# Patient Record
Sex: Female | Born: 1960
Health system: Southern US, Community
[De-identification: ages and names within clinical notes are randomized; demographics above are authoritative.]

## PROBLEM LIST (undated history)

## (undated) DIAGNOSIS — K219 Gastro-esophageal reflux disease without esophagitis: Secondary | ICD-10-CM

## (undated) DIAGNOSIS — F419 Anxiety disorder, unspecified: Secondary | ICD-10-CM

## (undated) HISTORY — PX: OTHER SURGICAL HISTORY: SHX169

## (undated) HISTORY — DX: Anxiety disorder, unspecified: F41.9

## (undated) HISTORY — PX: TUBAL LIGATION: SHX77

## (undated) HISTORY — DX: Gastro-esophageal reflux disease without esophagitis: K21.9

## (undated) HISTORY — PX: TONSILLECTOMY: SUR1361

## (undated) HISTORY — PX: LAPAROSCOPIC CHOLECYSTECTOMY: SUR755

---

## 2004-09-19 ENCOUNTER — Ambulatory Visit: Payer: Self-pay

## 2005-12-31 ENCOUNTER — Ambulatory Visit: Payer: Self-pay

## 2007-01-06 ENCOUNTER — Ambulatory Visit: Payer: Self-pay

## 2008-02-29 ENCOUNTER — Ambulatory Visit: Payer: Self-pay

## 2010-01-01 ENCOUNTER — Ambulatory Visit: Payer: Self-pay

## 2010-01-16 ENCOUNTER — Ambulatory Visit: Payer: Self-pay

## 2011-05-27 ENCOUNTER — Ambulatory Visit: Payer: Self-pay | Admitting: General Practice

## 2012-07-29 ENCOUNTER — Ambulatory Visit: Payer: Self-pay

## 2012-11-04 ENCOUNTER — Ambulatory Visit: Payer: Self-pay | Admitting: Unknown Physician Specialty

## 2012-11-07 LAB — PATHOLOGY REPORT

## 2013-03-24 ENCOUNTER — Ambulatory Visit: Payer: Self-pay | Admitting: Family Medicine

## 2013-07-31 ENCOUNTER — Ambulatory Visit: Payer: Self-pay

## 2014-05-30 ENCOUNTER — Encounter: Payer: Self-pay | Admitting: *Deleted

## 2014-12-21 ENCOUNTER — Encounter: Payer: Self-pay | Admitting: Family Medicine

## 2014-12-21 ENCOUNTER — Ambulatory Visit (INDEPENDENT_AMBULATORY_CARE_PROVIDER_SITE_OTHER): Payer: 59 | Admitting: Family Medicine

## 2014-12-21 VITALS — BP 110/80 | HR 76 | Temp 98.3°F | Resp 16 | Ht 62.0 in | Wt 228.4 lb

## 2014-12-21 DIAGNOSIS — R202 Paresthesia of skin: Secondary | ICD-10-CM

## 2014-12-21 DIAGNOSIS — E785 Hyperlipidemia, unspecified: Secondary | ICD-10-CM | POA: Insufficient documentation

## 2014-12-21 MED ORDER — VALACYCLOVIR HCL 1 G PO TABS: 1000.0000 mg | ORAL_TABLET | Freq: Three times a day (TID) | ORAL | Status: DC

## 2014-12-21 NOTE — Patient Instructions (Signed)
Let us know if there are new symptoms or not improving.

## 2014-12-21 NOTE — Progress Notes (Signed)
Subjective:     Patient ID: Kaylee Jones, female   DOB: 1960-10-14, 54 y.o.   MRN: [id number]  HPI  Chief Complaint  Patient presents with  . Rash    Patient comes in office today with concerns of a possible shingles outbreak. Patient reports that she has had itching and burning of the skin on the right side of her abdomen since Friday. Patient reports there is no visible rash but that she has had shingles before and had similar symptoms.   States it started out as an area of pain one week ago but now feels skin sensitivity/burning. Prior episode of shingles was in the same location. 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 Denies change in bowel or bladder habits. S/P cholecystectomy.   Review of Systems  Constitutional: Negative for fever and chills.       Objective:   Physical Exam  Constitutional: She appears well-developed and well-nourished. No distress.  Abdominal: Soft. There is no tenderness.  Localizes discomfort to right upper quadrant but no rash noted.       Assessment:    1. Paresthesias: most likely early shingles. - valACYclovir (VALTREX) 1000 MG tablet; Take 1 tablet (1,000 mg total) by mouth 3 (three) times daily.  Dispense: 21 tablet; Refill: 0    Plan:    F/u if not improving or new symptoms.

## 2016-02-13 ENCOUNTER — Ambulatory Visit: Payer: Self-pay | Admitting: Physician Assistant

## 2016-02-13 ENCOUNTER — Encounter: Payer: Self-pay | Admitting: Physician Assistant

## 2016-02-13 VITALS — BP 140/92 | HR 88 | Temp 99.5°F

## 2016-02-13 DIAGNOSIS — J069 Acute upper respiratory infection, unspecified: Secondary | ICD-10-CM

## 2016-02-13 LAB — POCT INFLUENZA A/B
INFLUENZA B, POC: NEGATIVE
Influenza A, POC: NEGATIVE

## 2016-02-13 MED ORDER — AZITHROMYCIN 250 MG PO TABS
ORAL_TABLET | ORAL | 0 refills | Status: DC
Start: 2016-02-13 — End: 2016-03-09

## 2016-02-13 NOTE — Progress Notes (Signed)
S: C/o runny nose and congestion with dry cough for 3 days, + fever, chills, denies cp/sob, v/d; mucus was green this am but clear throughout the day, cough is sporadic,   Using otc meds: took alka seltzer cold with fever reducer this am  O: PE: vitals w temp at 99.5 nad,  perrl eomi, normocephalic, tms dull, nasal mucosa red and swollen, throat injected, neck supple no lymph, lungs c t a, cv rrr, neuro intact, flu swab neg  A:  Acute flu like illness   P: drink fluids, continue regular meds , use otc meds of choice, return if not improving in 5 days, return earlier if worsening , zpack

## 2016-03-09 ENCOUNTER — Ambulatory Visit: Payer: Self-pay | Admitting: Physician Assistant

## 2016-03-09 ENCOUNTER — Encounter: Payer: Self-pay | Admitting: Physician Assistant

## 2016-03-09 VITALS — BP 142/94 | HR 84 | Temp 98.5°F

## 2016-03-09 DIAGNOSIS — J209 Acute bronchitis, unspecified: Secondary | ICD-10-CM

## 2016-03-09 MED ORDER — LEVOFLOXACIN 500 MG PO TABS: 500.0000 mg | ORAL_TABLET | Freq: Every day | ORAL | 0 refills | Status: DC

## 2016-03-09 MED ORDER — ALBUTEROL SULFATE HFA 108 (90 BASE) MCG/ACT IN AERS: 2.0000 | INHALATION_SPRAY | Freq: Four times a day (QID) | RESPIRATORY_TRACT | 0 refills | Status: DC | PRN

## 2016-03-09 MED ORDER — METHYLPREDNISOLONE 4 MG PO TBPK: ORAL_TABLET | ORAL | 0 refills | Status: DC

## 2016-03-09 NOTE — Progress Notes (Addendum)
S: c/o continued cough and congestion, some wheezing, low grade temp today, no body aches, finished zpack in December but never got well, just can't quit coughing, no cp/sob, no v/d  O: vitals wnl, nad, tms clear, nasal mucosa red, throat injected, neck supple no lymph, lungs c t a, cv rrr,   A: acute bronchitis  P: levaquin 500mg  qd, medrol dose pack, albuterol inhaler, if temp goes up return to clinic for repeat flu swab as was negative in December  Pt called and is still coughing, ?if could get cxr, cxr ordered

## 2016-04-01 ENCOUNTER — Ambulatory Visit
Admission: RE | Admit: 2016-04-01 | Discharge: 2016-04-01 | Disposition: A | Discharge status: Home / Self Care | Payer: 59 | Source: Ambulatory Visit | Attending: Physician Assistant | Admitting: Physician Assistant

## 2016-04-01 DIAGNOSIS — J209 Acute bronchitis, unspecified: Secondary | ICD-10-CM | POA: Insufficient documentation

## 2016-04-01 DIAGNOSIS — R05 Cough: Secondary | ICD-10-CM | POA: Insufficient documentation

## 2016-04-01 NOTE — Addendum Note (Signed)
Addended by: Versie Starks on: 04/01/2016 10:59 AM   Modules accepted: Orders

## 2016-08-13 DIAGNOSIS — H5711 Ocular pain, right eye: Secondary | ICD-10-CM | POA: Diagnosis not present

## 2017-02-05 ENCOUNTER — Encounter: Payer: Self-pay | Admitting: Family Medicine

## 2017-02-05 ENCOUNTER — Other Ambulatory Visit: Payer: Self-pay | Admitting: Family Medicine

## 2017-02-05 DIAGNOSIS — Z1239 Encounter for other screening for malignant neoplasm of breast: Secondary | ICD-10-CM

## 2017-02-12 ENCOUNTER — Ambulatory Visit (INDEPENDENT_AMBULATORY_CARE_PROVIDER_SITE_OTHER): Payer: 59 | Admitting: Family Medicine

## 2017-02-12 ENCOUNTER — Other Ambulatory Visit: Payer: Self-pay | Admitting: Family Medicine

## 2017-02-12 ENCOUNTER — Encounter: Payer: Self-pay | Admitting: Family Medicine

## 2017-02-12 VITALS — BP 116/82 | HR 82 | Temp 98.9°F | Resp 16 | Ht 62.0 in | Wt 231.0 lb

## 2017-02-12 DIAGNOSIS — Z87898 Personal history of other specified conditions: Secondary | ICD-10-CM | POA: Diagnosis not present

## 2017-02-12 DIAGNOSIS — E782 Mixed hyperlipidemia: Secondary | ICD-10-CM

## 2017-02-12 DIAGNOSIS — Z1159 Encounter for screening for other viral diseases: Secondary | ICD-10-CM | POA: Diagnosis not present

## 2017-02-12 DIAGNOSIS — F439 Reaction to severe stress, unspecified: Secondary | ICD-10-CM

## 2017-02-12 DIAGNOSIS — Z0000 Encounter for general adult medical examination without abnormal findings: Secondary | ICD-10-CM

## 2017-02-12 NOTE — Patient Instructions (Addendum)
We will call you with the lab results Encourage walking/exercise program 30 minutes daily.

## 2017-02-12 NOTE — Progress Notes (Signed)
Subjective:     Patient ID: Kaylee Jones, female   DOB: [birth year], 56 y.o.   MRN: [id number] Chief Complaint  Patient presents with  . Annual Exam    Pt reports that she has been feeling well, sleeping well bu tnot excerising. Declines Td today. she thinks she has gotten it at work. Pt had flu vaccine.   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  Review of Systems General: Feeling well HEENT: regular dental visits and pending eye exam (glasses). Cardiovascular: no chest pain or shortness of breath. Reports history of palpitations in the past and wishes a baseline EKG. GI: occasional heartburn, no change in bowel habits or blood in the stool. Colonoscopy due 2024. Has episodic hemorrhoids which she treats with preparation. Denis straining but does like to sit on commode to read. GU:  no change in bladder habits. Pap at [facility] in 2015. Psychiatric: not depressed,  Rarely will use Xanax for stress ("I keep it in my purse"). Musculoskeletal: Has hx of left knee pain with w.b.but resolved spontaneously.    Objective:   Physical Exam  Constitutional: She appears well-developed and well-nourished. No distress.  Eyes: PERRLA, EOMI Neck: no thyromegaly, tenderness or nodules, no cervical adenopathy or carotid bruits. ENT: TM's intact without inflammation; No tonsillar enlargement or exudate, Lungs: Clear Breasts: no axillary adenopathy, no breast mass, dimpling or nipple discharge. Heart : RRR without murmur or gallop Abd: bowel sounds present, soft, non-tender, no organomegaly Extremities: no edema     Assessment:    1. Annual physical exam - COMPLETE METABOLIC PANEL WITH GFR  2. History of palpitations - EKG 12-Lead - COMPLETE METABOLIC PANEL WITH GFR  3. Mixed hyperlipidemia - Lipid panel  4. Encounter for hepatitis C screening test for low risk patient - Hepatitis C Antibody  5. Situational stress: Xanax prn    Plan:    Further f/u pending lab work. Encouraged  regular exercise.

## 2017-02-13 LAB — COMPLETE METABOLIC PANEL WITH GFR
AG Ratio: 1.3 (calc) (ref 1.0–2.5)
ALKALINE PHOSPHATASE (APISO): 94 U/L (ref 33–130)
ALT: 14 U/L (ref 6–29)
AST: 16 U/L (ref 10–35)
Albumin: 3.8 g/dL (ref 3.6–5.1)
BILIRUBIN TOTAL: 0.5 mg/dL (ref 0.2–1.2)
BUN: 9 mg/dL (ref 7–25)
CHLORIDE: 107 mmol/L (ref 98–110)
CO2: 26 mmol/L (ref 20–32)
Calcium: 8.4 mg/dL — ABNORMAL LOW (ref 8.6–10.4)
Creat: 0.81 mg/dL (ref 0.50–1.05)
GFR, EST AFRICAN AMERICAN: 95 mL/min/{1.73_m2} (ref >=60)
GFR, Est Non African American: 82 mL/min/{1.73_m2} (ref >=60)
GLUCOSE: 104 mg/dL — AB (ref 65–99)
Globulin: 2.9 g/dL (calc) (ref 1.9–3.7)
POTASSIUM: 4.4 mmol/L (ref 3.5–5.3)
Sodium: 140 mmol/L (ref 135–146)
TOTAL PROTEIN: 6.7 g/dL (ref 6.1–8.1)

## 2017-02-13 LAB — LIPID PANEL
CHOL/HDL RATIO: 4.3 (calc) (ref <5.0)
Cholesterol: 235 mg/dL — ABNORMAL HIGH (ref <200)
HDL: 55 mg/dL (ref >50)
LDL Cholesterol (Calc): 157 mg/dL (calc) — ABNORMAL HIGH
NON-HDL CHOLESTEROL (CALC): 180 mg/dL — AB (ref <130)
TRIGLYCERIDES: 109 mg/dL (ref <150)

## 2017-02-13 LAB — HEPATITIS C ANTIBODY
HEP C AB: NONREACTIVE
SIGNAL TO CUT-OFF: 0.01 (ref <1.00)

## 2017-02-15 ENCOUNTER — Encounter: Payer: Self-pay | Admitting: Family Medicine

## 2017-02-15 ENCOUNTER — Telehealth: Payer: Self-pay

## 2017-02-15 NOTE — Telephone Encounter (Signed)
Unable to reach patient at this time, home line is busy will try contacting patient at a later time. KW

## 2017-02-15 NOTE — Telephone Encounter (Signed)
-----   Message from Carmon Ginsberg, [place] sent at 02/15/2017  7:38 AM EST ----- Mildly elevated cholesterol. Your calculated 10 year risk for developing cardiovascular disease is low at 1.9%. Sugar is mildly elevated. This should improved with regular exercise. Mildly decreased calcium. Add calcium rich foods. Hepatitis C antibody negative.

## 2017-02-16 NOTE — Telephone Encounter (Signed)
Patient advised.KW 

## 2017-02-16 NOTE — Telephone Encounter (Signed)
Pt returned call.  Tried to transfer, no answer.  teri

## 2017-02-16 NOTE — Telephone Encounter (Signed)
Unable to leave a voicemail at this time voice mail box is full . KW

## 2017-03-03 ENCOUNTER — Ambulatory Visit
Admission: RE | Admit: 2017-03-03 | Discharge: 2017-03-03 | Disposition: A | Discharge status: Home / Self Care | Payer: 59 | Source: Ambulatory Visit | Attending: Family Medicine | Admitting: Family Medicine

## 2017-03-03 DIAGNOSIS — Z1231 Encounter for screening mammogram for malignant neoplasm of breast: Secondary | ICD-10-CM | POA: Insufficient documentation

## 2017-03-03 DIAGNOSIS — Z1239 Encounter for other screening for malignant neoplasm of breast: Secondary | ICD-10-CM

## 2017-09-06 DIAGNOSIS — L720 Epidermal cyst: Secondary | ICD-10-CM | POA: Diagnosis not present

## 2017-09-06 DIAGNOSIS — D2262 Melanocytic nevi of left upper limb, including shoulder: Secondary | ICD-10-CM | POA: Diagnosis not present

## 2017-09-06 DIAGNOSIS — L300 Nummular dermatitis: Secondary | ICD-10-CM | POA: Diagnosis not present

## 2017-09-06 DIAGNOSIS — D2271 Melanocytic nevi of right lower limb, including hip: Secondary | ICD-10-CM | POA: Diagnosis not present

## 2017-09-06 DIAGNOSIS — D225 Melanocytic nevi of trunk: Secondary | ICD-10-CM | POA: Diagnosis not present

## 2017-09-06 DIAGNOSIS — D485 Neoplasm of uncertain behavior of skin: Secondary | ICD-10-CM | POA: Diagnosis not present

## 2017-09-06 DIAGNOSIS — D2261 Melanocytic nevi of right upper limb, including shoulder: Secondary | ICD-10-CM | POA: Diagnosis not present

## 2017-09-06 DIAGNOSIS — D2272 Melanocytic nevi of left lower limb, including hip: Secondary | ICD-10-CM | POA: Diagnosis not present

## 2017-09-06 DIAGNOSIS — L570 Actinic keratosis: Secondary | ICD-10-CM | POA: Diagnosis not present

## 2017-09-20 DIAGNOSIS — L570 Actinic keratosis: Secondary | ICD-10-CM | POA: Diagnosis not present

## 2017-11-29 ENCOUNTER — Ambulatory Visit (INDEPENDENT_AMBULATORY_CARE_PROVIDER_SITE_OTHER): Payer: 59 | Admitting: Family Medicine

## 2017-11-29 ENCOUNTER — Encounter: Payer: Self-pay | Admitting: Family Medicine

## 2017-11-29 VITALS — BP 134/90 | HR 81 | Temp 98.8°F | Resp 16 | Wt 211.8 lb

## 2017-11-29 DIAGNOSIS — S8012XA Contusion of left lower leg, initial encounter: Secondary | ICD-10-CM | POA: Diagnosis not present

## 2017-11-29 DIAGNOSIS — L301 Dyshidrosis [pompholyx]: Secondary | ICD-10-CM | POA: Diagnosis not present

## 2017-11-29 MED ORDER — NAFTIFINE HCL 1 % EX CREA: TOPICAL_CREAM | Freq: Every day | CUTANEOUS | 0 refills | Status: DC

## 2017-11-29 NOTE — Progress Notes (Signed)
  Subjective:     Patient ID: Kaylee Jones, female   DOB: 28-Feb-1961, 57 y.o.   MRN: [id number] Chief Complaint  Patient presents with  . Nail Problem    Patient comes in office today with concerns of discoloration of her left great toe for the past 6-4months. Paitent reports redness and swelling she states that she has seen dermatology in past and was prescribed steroid, antifungal cream and antibiotic with no improvement.   . Ankle Pain    Patient comes in office today with concerns of tenderness and bruising of her right ankle for one month or so. Patient reports that he was headbutted by her goat.    HPI States she has been treated with high potency steroid creams and two types of otc antifungals. Reports persistent itching, fissuring, and skin/nail changes.  Review of Systems     Objective:   Physical Exam  Constitutional: She appears well-developed and well-nourished. No distress.  Skin:  Right  Medial calf with two residual contusions with no underlying induration or hematoma. Left foot with area of denuded skin below the first toe and a small fissure over her first metatarsal area, Minimal scaling. First toenail is abnormal in appearance with white reticular pattern.       Assessment:    1. Dyshidrotic eczema:  ? Tinea pedis:Try Naftin cream and return to dermatology if not improving.  2. Multiple leg contusions, left, initial encounter: monitor     Plan:    Dermatology as needed.

## 2017-11-29 NOTE — Patient Instructions (Addendum)
Do follow up with the dermatologist regarding your left foot if not improving.

## 2017-12-16 DIAGNOSIS — B351 Tinea unguium: Secondary | ICD-10-CM | POA: Diagnosis not present

## 2017-12-16 DIAGNOSIS — L309 Dermatitis, unspecified: Secondary | ICD-10-CM | POA: Diagnosis not present

## 2018-02-10 DIAGNOSIS — H524 Presbyopia: Secondary | ICD-10-CM | POA: Diagnosis not present

## 2018-03-18 DIAGNOSIS — Z01419 Encounter for gynecological examination (general) (routine) without abnormal findings: Secondary | ICD-10-CM | POA: Diagnosis not present

## 2018-03-18 DIAGNOSIS — Z1239 Encounter for other screening for malignant neoplasm of breast: Secondary | ICD-10-CM | POA: Diagnosis not present

## 2018-03-18 LAB — HM PAP SMEAR: HM Pap smear: NEGATIVE

## 2018-03-25 DIAGNOSIS — B351 Tinea unguium: Secondary | ICD-10-CM | POA: Diagnosis not present

## 2018-03-25 DIAGNOSIS — B353 Tinea pedis: Secondary | ICD-10-CM | POA: Diagnosis not present

## 2018-08-01 DIAGNOSIS — K602 Anal fissure, unspecified: Secondary | ICD-10-CM

## 2018-08-01 HISTORY — DX: Anal fissure, unspecified: K60.2

## 2018-08-25 DIAGNOSIS — K648 Other hemorrhoids: Secondary | ICD-10-CM | POA: Diagnosis not present

## 2018-08-25 DIAGNOSIS — K600 Acute anal fissure: Secondary | ICD-10-CM | POA: Diagnosis not present

## 2018-08-29 DIAGNOSIS — K648 Other hemorrhoids: Secondary | ICD-10-CM | POA: Insufficient documentation

## 2018-11-15 DIAGNOSIS — K648 Other hemorrhoids: Secondary | ICD-10-CM | POA: Diagnosis not present

## 2018-11-15 DIAGNOSIS — K625 Hemorrhage of anus and rectum: Secondary | ICD-10-CM | POA: Diagnosis not present

## 2018-11-15 DIAGNOSIS — K600 Acute anal fissure: Secondary | ICD-10-CM | POA: Diagnosis not present

## 2019-01-04 ENCOUNTER — Encounter: Payer: Self-pay | Admitting: Family Medicine

## 2019-01-05 ENCOUNTER — Telehealth: Payer: Self-pay

## 2019-01-05 NOTE — Telephone Encounter (Signed)
Contacted patient regarding her request for a referral. She was informed that since she has not been seen in office in 1 year and has not continued care with another PCP at the office she would need to be seen in office before a referral could be made. She stated she was going to contact the location she is trying to get referred to as her insurance does not need a referral and was unable to wait until she could be seen as a new established patient with another provider.

## 2019-01-07 NOTE — Telephone Encounter (Signed)
Perfect

## 2019-01-13 ENCOUNTER — Encounter: Payer: Self-pay | Admitting: Family Medicine

## 2019-01-13 ENCOUNTER — Ambulatory Visit (INDEPENDENT_AMBULATORY_CARE_PROVIDER_SITE_OTHER): Payer: 59 | Admitting: Family Medicine

## 2019-01-13 ENCOUNTER — Other Ambulatory Visit: Payer: Self-pay

## 2019-01-13 VITALS — BP 126/85 | HR 70 | Temp 97.1°F | Resp 16 | Ht 61.0 in | Wt 212.8 lb

## 2019-01-13 DIAGNOSIS — K649 Unspecified hemorrhoids: Secondary | ICD-10-CM | POA: Insufficient documentation

## 2019-01-13 DIAGNOSIS — Z0000 Encounter for general adult medical examination without abnormal findings: Secondary | ICD-10-CM | POA: Diagnosis not present

## 2019-01-13 DIAGNOSIS — E782 Mixed hyperlipidemia: Secondary | ICD-10-CM

## 2019-01-13 DIAGNOSIS — Z1231 Encounter for screening mammogram for malignant neoplasm of breast: Secondary | ICD-10-CM

## 2019-01-13 NOTE — Assessment & Plan Note (Signed)
Discussed importance of healthy weight management Discussed diet and exercise  

## 2019-01-13 NOTE — Progress Notes (Addendum)
Patient: Kaylee Jones Female    DOB: 12-29-1960   58 y.o.   MRN: [id number] Visit Date: 01/13/2019  Today's Provider: Lavon Paganini, MD   Chief Complaint  Patient presents with  . Annual Exam  . Referral   Subjective:     HPI    Annual physical exam Kaylee Jones  is a 58 y.o. female  who presents today for health maintenance and complete physical. She feels fairly well. She reports exercising none. She reports she is sleeping fairly well.   Patient here today requesting a referral to GI due hemorrhoids. Patient reports she has seen GI at [facility], but would like to see someone in the [facility] system.  She was previously followed by Dr Vira Agar before he retired.  She has used anusol with some relief.  GERD is better since starting intermittent fasting  Would like to switch GYN care from [facility] to our office as well.  Allergies  Allergen Reactions  . Codeine     Patient reports she hallucinates     No current outpatient medications on file.  Review of Systems  Constitutional: Negative.   HENT: Negative.   Eyes: Negative.   Respiratory: Negative.   Cardiovascular: Negative.   Gastrointestinal: Positive for anal bleeding and rectal pain. Negative for abdominal distention, abdominal pain, diarrhea, nausea and vomiting.  Endocrine: Negative.   Genitourinary: Negative.   Musculoskeletal: Negative.   Skin: Negative.   Allergic/Immunologic: Negative.   Neurological: Negative.   Hematological: Negative.   Psychiatric/Behavioral: Negative.      Social History   Tobacco Use  . Smoking status: Never Smoker  . Smokeless tobacco: Never Used  Substance Use Topics  . Alcohol use: Yes    Alcohol/week: 0.0 standard drinks    Comment: rare    Family History  Problem Relation Age of Onset  . Hypertension Mother   . Esophageal varices Father     Past Medical History:  Diagnosis Date  . Anxiety   . GERD (gastroesophageal reflux disease)      Past Surgical  History:  Procedure Laterality Date  . LAPAROSCOPIC CHOLECYSTECTOMY    . TONSILLECTOMY    . TUBAL LIGATION         Objective:   BP 126/85 (BP Location: Left Arm, Patient Position: Sitting, Cuff Size: Large)   Pulse 70   Temp (!) 97.1 F (36.2 C) (Temporal)   Resp 16   Ht 5\' 1"  (1.549 m)   Wt 212 lb 12.8 oz (96.5 kg)   LMP 04/06/2016   BMI 40.21 kg/m  Vitals:   01/13/19 1333  BP: 126/85  Pulse: 70  Resp: 16  Temp: (!) 97.1 F (36.2 C)  TempSrc: Temporal  Weight: 212 lb 12.8 oz (96.5 kg)  Height: 5\' 1"  (1.549 m)  Body mass index is 40.21 kg/m.   Physical Exam Vitals signs reviewed.  Constitutional:      General: She is not in acute distress.    Appearance: Normal appearance. She is well-developed. She is not diaphoretic.  HENT:     Head: Normocephalic and atraumatic.     Right Ear: Tympanic membrane, ear canal and external ear normal.     Left Ear: Tympanic membrane, ear canal and external ear normal.  Eyes:     General: No scleral icterus.    Conjunctiva/sclera: Conjunctivae normal.     Pupils: Pupils are equal, round, and reactive to light.  Neck:     Musculoskeletal: Neck supple.  Thyroid: No thyromegaly.  Cardiovascular:     Rate and Rhythm: Normal rate and regular rhythm.     Pulses: Normal pulses.     Heart sounds: Normal heart sounds. No murmur.  Pulmonary:     Effort: Pulmonary effort is normal. No respiratory distress.     Breath sounds: Normal breath sounds. No wheezing or rales.  Abdominal:     General: There is no distension.     Palpations: Abdomen is soft.     Tenderness: There is no abdominal tenderness.  Musculoskeletal:        General: No deformity.     Right lower leg: No edema.     Left lower leg: No edema.  Lymphadenopathy:     Cervical: No cervical adenopathy.  Skin:    General: Skin is warm and dry.     Capillary Refill: Capillary refill takes less than 2 seconds.     Findings: No rash.  Neurological:     Mental Status: She  is alert and oriented to person, place, and time. Mental status is at baseline.  Psychiatric:        Mood and Affect: Mood normal.        Behavior: Behavior normal.        Thought Content: Thought content normal.     Depression screen [facility] 2/9 01/13/2019 02/12/2017  Decreased Interest 0 0  Down, Depressed, Hopeless 0 0  PHQ - 2 Score 0 0  Altered sleeping 0 -  Tired, decreased energy 0 -  Change in appetite 0 -  Feeling bad or failure about yourself  0 -  Trouble concentrating 0 -  Moving slowly or fidgety/restless 0 -  Suicidal thoughts 0 -  PHQ-9 Score 0 -  Difficult doing work/chores Not difficult at all -     Assessment & Plan    Routine Health Maintenance and Physical Exam  Exercise Activities and Dietary recommendations Goals   None     Immunization History  Administered Date(s) Administered  . Influenza-Unspecified 12/07/2018     Health Maintenance  Topic Date Due  . HIV Screening  05/11/1976  . TETANUS/TDAP  05/11/1980  . MAMMOGRAM  03/04/2019  . PAP SMEAR-Modifier  03/18/2021  . COLONOSCOPY  11/05/2022  . INFLUENZA VACCINE  Completed  . Hepatitis C Screening  Completed      Discussed health benefits of physical activity, and encouraged her to engage in regular exercise appropriate for her age and condition.    Will abstract last pap smear from [place] --------------------------------------------------------------------  Problem List Items Addressed This Visit      Cardiovascular and Mediastinum   Hemorrhoids    Previously followed by [facility] and has upcoming colonoscopy Prefers to switch her care to [facility] (Dr Vicente Males) Referral placed today      Relevant Orders   Ambulatory referral to Gastroenterology     Other   Hyperlipidemia    Not on statin Repeat FLP and CMP      Relevant Orders   Lipid panel   CMP (Comprehensive metabolic panel)   Morbid obesity ([place])    Discussed importance of healthy weight management Discussed diet and  exercise       Relevant Orders   Lipid panel   CMP (Comprehensive metabolic panel)   CBC    Other Visit Diagnoses    Encounter for annual physical exam    -  Primary   Relevant Orders   Lipid panel   CMP (Comprehensive metabolic panel)  CBC   Encounter for screening mammogram for malignant neoplasm of breast       Relevant Orders   MM 3D SCREEN BREAST BILATERAL       Return in about 1 year (around 01/13/2020) for CPE.   The entirety of the information documented in the History of Present Illness, Review of Systems and Physical Exam were personally obtained by me. Portions of this information were initially documented by Lynford Humphrey , CMA and reviewed by me for thoroughness and accuracy.    Aunesti Pellegrino, Dionne Bucy, MD MPH [facility]

## 2019-01-13 NOTE — Assessment & Plan Note (Signed)
Not on statin Repeat FLP and CMP

## 2019-01-13 NOTE — Patient Instructions (Signed)

## 2019-01-13 NOTE — Assessment & Plan Note (Signed)
Previously followed by [facility] and has upcoming colonoscopy Prefers to switch her care to [facility] (Dr Vicente Males) Referral placed today

## 2019-01-14 LAB — COMPREHENSIVE METABOLIC PANEL
ALT: 12 IU/L (ref 0–32)
AST: 18 IU/L (ref 0–40)
Albumin/Globulin Ratio: 1.7 (ref 1.2–2.2)
Albumin: 4.3 g/dL (ref 3.8–4.9)
Alkaline Phosphatase: 110 IU/L (ref 39–117)
BUN/Creatinine Ratio: 10 (ref 9–23)
BUN: 8 mg/dL (ref 6–24)
Bilirubin Total: 0.4 mg/dL (ref 0.0–1.2)
CO2: 20 mmol/L (ref 20–29)
Calcium: 9.2 mg/dL (ref 8.7–10.2)
Chloride: 104 mmol/L (ref 96–106)
Creatinine, Ser: 0.8 mg/dL (ref 0.57–1.00)
GFR calc Af Amer: 95 mL/min/{1.73_m2} (ref >59)
GFR calc non Af Amer: 82 mL/min/{1.73_m2} (ref >59)
Globulin, Total: 2.6 g/dL (ref 1.5–4.5)
Glucose: 80 mg/dL (ref 65–99)
Potassium: 4.3 mmol/L (ref 3.5–5.2)
Sodium: 142 mmol/L (ref 134–144)
Total Protein: 6.9 g/dL (ref 6.0–8.5)

## 2019-01-14 LAB — CBC
Hematocrit: 38.2 % (ref 34.0–46.6)
Hemoglobin: 13 g/dL (ref 11.1–15.9)
MCH: 28.3 pg (ref 26.6–33.0)
MCHC: 34 g/dL (ref 31.5–35.7)
MCV: 83 fL (ref 79–97)
Platelets: 355 10*3/uL (ref 150–450)
RBC: 4.6 x10E6/uL (ref 3.77–5.28)
RDW: 14.6 % (ref 11.7–15.4)
WBC: 9.4 10*3/uL (ref 3.4–10.8)

## 2019-01-14 LAB — LIPID PANEL
Chol/HDL Ratio: 4.8 ratio — ABNORMAL HIGH (ref 0.0–4.4)
Cholesterol, Total: 255 mg/dL — ABNORMAL HIGH (ref 100–199)
HDL: 53 mg/dL (ref >39)
LDL Chol Calc (NIH): 177 mg/dL — ABNORMAL HIGH (ref 0–99)
Triglycerides: 139 mg/dL (ref 0–149)
VLDL Cholesterol Cal: 25 mg/dL (ref 5–40)

## 2019-01-17 ENCOUNTER — Telehealth: Payer: Self-pay

## 2019-01-17 NOTE — Telephone Encounter (Signed)
-----   Message from Virginia Crews, MD sent at 01/17/2019  8:35 AM EST ----- Normal blood counts, blood sugar, kidney function, liver function, electrolytes.  Cholesterol is high, but 10-year risk of heart disease/stroke is low at 3%.  No need for medications at this time, but I do recommend diet low in saturated fat and regular exercise - 30 min at least 5 times per week

## 2019-01-17 NOTE — Telephone Encounter (Signed)
Patient advised as below.  

## 2019-02-06 ENCOUNTER — Other Ambulatory Visit: Payer: Self-pay

## 2019-02-06 ENCOUNTER — Other Ambulatory Visit
Admission: RE | Admit: 2019-02-06 | Discharge: 2019-02-06 | Disposition: A | Discharge status: Home / Self Care | Payer: 59 | Source: Ambulatory Visit | Attending: Internal Medicine | Admitting: Internal Medicine

## 2019-02-06 DIAGNOSIS — Z01812 Encounter for preprocedural laboratory examination: Secondary | ICD-10-CM | POA: Insufficient documentation

## 2019-02-06 DIAGNOSIS — Z20828 Contact with and (suspected) exposure to other viral communicable diseases: Secondary | ICD-10-CM | POA: Diagnosis not present

## 2019-02-06 LAB — SARS CORONAVIRUS 2 (TAT 6-24 HRS): SARS Coronavirus 2: NEGATIVE

## 2019-02-08 ENCOUNTER — Encounter: Payer: Self-pay | Admitting: *Deleted

## 2019-02-09 ENCOUNTER — Ambulatory Visit
Admission: RE | Admit: 2019-02-09 | Discharge: 2019-02-09 | Disposition: A | Discharge status: Home / Self Care | Payer: 59 | Attending: Internal Medicine | Admitting: Internal Medicine

## 2019-02-09 ENCOUNTER — Other Ambulatory Visit: Payer: Self-pay

## 2019-02-09 ENCOUNTER — Ambulatory Visit: Payer: 59 | Admitting: Certified Registered Nurse Anesthetist

## 2019-02-09 ENCOUNTER — Encounter
Admission: RE | Disposition: A | Discharge status: Home / Self Care | Payer: Self-pay | Source: Home / Self Care | Attending: Internal Medicine

## 2019-02-09 ENCOUNTER — Encounter: Payer: Self-pay | Admitting: Internal Medicine

## 2019-02-09 DIAGNOSIS — F419 Anxiety disorder, unspecified: Secondary | ICD-10-CM | POA: Insufficient documentation

## 2019-02-09 DIAGNOSIS — K649 Unspecified hemorrhoids: Secondary | ICD-10-CM | POA: Diagnosis not present

## 2019-02-09 DIAGNOSIS — Z79899 Other long term (current) drug therapy: Secondary | ICD-10-CM | POA: Diagnosis not present

## 2019-02-09 DIAGNOSIS — K635 Polyp of colon: Secondary | ICD-10-CM | POA: Diagnosis not present

## 2019-02-09 DIAGNOSIS — K921 Melena: Secondary | ICD-10-CM | POA: Insufficient documentation

## 2019-02-09 DIAGNOSIS — K625 Hemorrhage of anus and rectum: Secondary | ICD-10-CM | POA: Diagnosis not present

## 2019-02-09 DIAGNOSIS — K642 Third degree hemorrhoids: Secondary | ICD-10-CM | POA: Diagnosis not present

## 2019-02-09 DIAGNOSIS — K5730 Diverticulosis of large intestine without perforation or abscess without bleeding: Secondary | ICD-10-CM | POA: Diagnosis not present

## 2019-02-09 DIAGNOSIS — Z7689 Persons encountering health services in other specified circumstances: Secondary | ICD-10-CM | POA: Diagnosis not present

## 2019-02-09 DIAGNOSIS — K5790 Diverticulosis of intestine, part unspecified, without perforation or abscess without bleeding: Secondary | ICD-10-CM | POA: Diagnosis not present

## 2019-02-09 HISTORY — PX: COLONOSCOPY WITH PROPOFOL: SHX5780

## 2019-02-09 SURGERY — COLONOSCOPY WITH PROPOFOL
Anesthesia: General

## 2019-02-09 MED ORDER — LIDOCAINE HCL (CARDIAC) PF 100 MG/5ML IV SOSY
PREFILLED_SYRINGE | INTRAVENOUS | Status: DC | PRN
  Administered 2019-02-09: 50 mg via INTRAVENOUS

## 2019-02-09 MED ORDER — SODIUM CHLORIDE 0.9 % IV SOLN
INTRAVENOUS | Status: DC
  Administered 2019-02-09: 1000 mL via INTRAVENOUS

## 2019-02-09 MED ORDER — EPHEDRINE SULFATE 50 MG/ML IJ SOLN
INTRAMUSCULAR | Status: AC
  Filled 2019-02-09: qty 1

## 2019-02-09 MED ORDER — PHENYLEPHRINE HCL (PRESSORS) 10 MG/ML IV SOLN
INTRAVENOUS | Status: AC
  Filled 2019-02-09: qty 1

## 2019-02-09 MED ORDER — PROPOFOL 500 MG/50ML IV EMUL
INTRAVENOUS | Status: AC
  Filled 2019-02-09: qty 50

## 2019-02-09 MED ORDER — LIDOCAINE HCL (PF) 2 % IJ SOLN
INTRAMUSCULAR | Status: AC
  Filled 2019-02-09: qty 10

## 2019-02-09 MED ORDER — PROPOFOL 10 MG/ML IV BOLUS
INTRAVENOUS | Status: DC | PRN
  Administered 2019-02-09: 60 mg via INTRAVENOUS
  Administered 2019-02-09: 30 mg via INTRAVENOUS

## 2019-02-09 MED ORDER — PROPOFOL 500 MG/50ML IV EMUL
INTRAVENOUS | Status: DC | PRN
  Administered 2019-02-09: 165 ug/kg/min via INTRAVENOUS

## 2019-02-09 NOTE — H&P (Signed)
Outpatient short stay form Pre-procedure 02/09/2019 8:26 AM Kaylee Jones K. Alice Reichert, M.D.  Primary Physician: Lavon Paganini  Reason for visit:  Rectal bleeding  History of present illness:  Patient with intermittent rectal bleeding, no change in bowel habits. Last colonoscopy was about 6 years ago, two polyps were removed.     Current Facility-Administered Medications:  .  0.9 %  sodium chloride infusion, , Intravenous, Continuous, [place], Benay Pike, MD, Last Rate: 20 mL/hr at 02/09/19 0804, 1,000 mL at 02/09/19 0804  Medications Prior to Admission  Medication Sig Dispense Refill Last Dose  . acetaminophen (TYLENOL) 325 MG tablet Take 650 mg by mouth every 4 (four) hours as needed.   Past Week at Unknown time  . ALPRAZolam (XANAX) 0.25 MG tablet Take 0.25 mg by mouth as needed for anxiety.   Past Week at Unknown time  . DOCOSAHEXAENOIC ACID-EPA PO Take by mouth daily. FISH OIL   Past Week at Unknown time  . hydrocortisone (ANUSOL-HC) 25 MG suppository Place 25 mg rectally 2 (two) times daily. For 14 days   Past Week at Unknown time  . ibuprofen (ADVIL) 800 MG tablet Take 800 mg by mouth every 6 (six) hours as needed.   Past Week at Unknown time  . Psyllium (METAMUCIL PO) Take by mouth.   Past Week at Unknown time     Allergies  Allergen Reactions  . Codeine     Patient reports she hallucinates      Past Medical History:  Diagnosis Date  . Anal fissure 08/2018  . Anxiety   . GERD (gastroesophageal reflux disease)     Review of systems:  Otherwise negative.    Physical Exam  Gen: Alert, oriented. Appears stated age.  HEENT: [place]/AT. PERRLA. Lungs: CTA, no wheezes. CV: RR nl S1, S2. Abd: soft, benign, no masses. BS+ Ext: No edema. Pulses 2+    Planned procedures: Proceed with colonoscopy. The patient understands the nature of the planned procedure, indications, risks, alternatives and potential complications including but not limited to bleeding, infection, perforation,  damage to internal organs and possible oversedation/side effects from anesthesia. The patient agrees and gives consent to proceed.  Please refer to procedure notes for findings, recommendations and patient disposition/instructions.     Lyfe Monger K. Alice Reichert, M.D. Gastroenterology 02/09/2019  8:26 AM

## 2019-02-09 NOTE — Anesthesia Procedure Notes (Signed)
Date/Time: 02/09/2019 8:29 AM Performed by: Johnna Acosta, CRNA Pre-anesthesia Checklist: Patient identified, Emergency Drugs available, Suction available, Patient being monitored and Timeout performed Patient Re-evaluated:Patient Re-evaluated prior to induction Oxygen Delivery Method: Nasal cannula Preoxygenation: Pre-oxygenation with 100% oxygen Induction Type: IV induction

## 2019-02-09 NOTE — Interval H&P Note (Signed)
History and Physical Interval Note:  02/09/2019 8:29 AM  Kaylee Jones  has presented today for surgery, with the diagnosis of Rectal bleeding.  The various methods of treatment have been discussed with the patient and family. After consideration of risks, benefits and other options for treatment, the patient has consented to  Procedure(s): COLONOSCOPY WITH PROPOFOL (N/A) as a surgical intervention.  The patient's history has been reviewed, patient examined, no change in status, stable for surgery.  I have reviewed the patient's chart and labs.  Questions were answered to the patient's satisfaction.     [place], [place]

## 2019-02-09 NOTE — Op Note (Signed)
[facility] Gastroenterology Patient Name: Kaylee Jones Procedure Date: 02/09/2019 7:37 AM MRN: [id number] Account #: [id number] Date of Birth: March 10, 1960 Admit Type: Outpatient Age: 58 Room: [facility] ENDO ROOM 1 Gender: Female Note Status: Finalized Procedure:             Colonoscopy Indications:           Hematochezia Providers:             Benay Pike. Alice Reichert MD, MD Referring MD:          Dionne Bucy. Bacigalupo (Referring MD) Medicines:             Propofol per Anesthesia Complications:         No immediate complications. Procedure:             Pre-Anesthesia Assessment:                        - The risks and benefits of the procedure and the                         sedation options and risks were discussed with the                         patient. All questions were answered and informed                         consent was obtained.                        - Patient identification and proposed procedure were                         verified prior to the procedure by the nurse. The                         procedure was verified in the procedure room.                        - ASA Grade Assessment: III - A patient with severe                         systemic disease.                        - After reviewing the risks and benefits, the patient                         was deemed in satisfactory condition to undergo the                         procedure.                        After obtaining informed consent, the colonoscope was                         passed under direct vision. Throughout the procedure,                         the patient's blood pressure, pulse, and oxygen  saturations were monitored continuously. The                         Colonoscope was introduced through the anus and                         advanced to the the cecum, identified by appendiceal                         orifice and ileocecal valve. The colonoscopy was                performed without difficulty. The patient tolerated                         the procedure well. The quality of the bowel                         preparation was excellent. The ileocecal valve,                         appendiceal orifice, and rectum were photographed. Findings:      The perianal and digital rectal examinations were normal. Pertinent       negatives include normal sphincter tone and no palpable rectal lesions.      Non-bleeding internal hemorrhoids were found during retroflexion. The       hemorrhoids were Grade III (internal hemorrhoids that prolapse but       require manual reduction).      A 4 mm polyp was found in the sigmoid colon. The polyp was sessile. The       polyp was removed with a jumbo cold forceps. Resection and retrieval       were complete.      Many medium-mouthed diverticula were found in the sigmoid colon.      The exam was otherwise without abnormality. Impression:            - Non-bleeding internal hemorrhoids.                        - One 4 mm polyp in the sigmoid colon, removed with a                         jumbo cold forceps. Resected and retrieved.                        - Diverticulosis in the sigmoid colon.                        - The examination was otherwise normal. Recommendation:        - Patient has a contact number available for                         emergencies. The signs and symptoms of potential                         delayed complications were discussed with the patient.                         Return to normal activities tomorrow. Written  discharge instructions were provided to the patient.                        - Resume previous diet.                        - Continue present medications.                        - Repeat colonoscopy in 5 years for surveillance.                        - Return to nurse practitioner in 3 months.                        - Follow up with Stephens November, GI  Nurse                         Practioner, in office to discuss results and monitor                         progress.                        - Consider hemorrhoid banding if hemorrhoidal symptoms                         still present at followup.                        - High fiber diet. Procedure Code(s):     --- Professional ---                        [phone], Colonoscopy, flexible; with biopsy, single or                         multiple Diagnosis Code(s):     --- Professional ---                        K57.30, Diverticulosis of large intestine without                         perforation or abscess without bleeding                        K92.1, Melena (includes Hematochezia)                        K64.2, Third degree hemorrhoids                        K63.5, Polyp of colon CPT copyright 2019 American Medical Association. All rights reserved. The codes documented in this report are preliminary and upon coder review may  be revised to meet current compliance requirements. Efrain Sella MD, MD 02/09/2019 8:52:56 AM This report has been signed electronically. Number of Addenda: 0 Note Initiated On: 02/09/2019 7:37 AM Scope Withdrawal Time: 0 hours 7 minutes 54 seconds  Total Procedure Duration: 0 hours 13 minutes 46 seconds  Estimated Blood Loss:  Estimated blood loss: none.      [facility]

## 2019-02-09 NOTE — Transfer of Care (Signed)
Immediate Anesthesia Transfer of Care Note  Patient: Kaylee Jones  Procedure(s) Performed: COLONOSCOPY WITH PROPOFOL (N/A )  Patient Location: PACU  Anesthesia Type:General  Level of Consciousness: awake and drowsy  Airway & Oxygen Therapy: Patient Spontanous Breathing  Post-op Assessment: Report given to RN and Post -op Vital signs reviewed and stable  Post vital signs: Reviewed and stable  Last Vitals:  Vitals Value Taken Time  BP 121/84 02/09/19 0855  Temp    Pulse 74 02/09/19 0855  Resp 18 02/09/19 0855  SpO2 99 % 02/09/19 0855    Last Pain:  Vitals:   02/09/19 0751  TempSrc: Temporal  PainSc: 0-No pain         Complications: No apparent anesthesia complications

## 2019-02-09 NOTE — Anesthesia Preprocedure Evaluation (Signed)
Anesthesia Evaluation  Patient identified by MRN, date of birth, ID band Patient awake    Reviewed: Allergy & Precautions, H&P , NPO status , Patient's Chart, lab work & pertinent test results, reviewed documented beta blocker date and time   Airway Mallampati: II   Neck ROM: full    Dental  (+) Poor Dentition   Pulmonary neg pulmonary ROS,    Pulmonary exam normal        Cardiovascular negative cardio ROS Normal cardiovascular exam Rhythm:regular Rate:Normal     Neuro/Psych Anxiety negative neurological ROS  negative psych ROS   GI/Hepatic Neg liver ROS, GERD  Medicated,  Endo/Other  negative endocrine ROS  Renal/GU negative Renal ROS  negative genitourinary   Musculoskeletal   Abdominal   Peds  Hematology negative hematology ROS (+)   Anesthesia Other Findings Past Medical History: 08/2018: Anal fissure No date: Anxiety No date: GERD (gastroesophageal reflux disease) Past Surgical History: No date: LAPAROSCOPIC CHOLECYSTECTOMY No date: TONSILLECTOMY No date: TUBAL LIGATION   Reproductive/Obstetrics negative OB ROS                             Anesthesia Physical Anesthesia Plan  ASA: II  Anesthesia Plan: General   Post-op Pain Management:    Induction:   PONV Risk Score and Plan:   Airway Management Planned:   Additional Equipment:   Intra-op Plan:   Post-operative Plan:   Informed Consent: I have reviewed the patients History and Physical, chart, labs and discussed the procedure including the risks, benefits and alternatives for the proposed anesthesia with the patient or authorized representative who has indicated his/her understanding and acceptance.     Dental Advisory Given  Plan Discussed with: CRNA  Anesthesia Plan Comments:         Anesthesia Quick Evaluation

## 2019-02-09 NOTE — Anesthesia Post-op Follow-up Note (Signed)
Anesthesia QCDR form completed.        

## 2019-02-10 ENCOUNTER — Encounter: Payer: Self-pay | Admitting: *Deleted

## 2019-02-10 LAB — SURGICAL PATHOLOGY

## 2019-02-13 DIAGNOSIS — H524 Presbyopia: Secondary | ICD-10-CM | POA: Diagnosis not present

## 2019-02-16 NOTE — Anesthesia Postprocedure Evaluation (Signed)
Anesthesia Post Note  Patient: Kaylee Jones  Procedure(s) Performed: COLONOSCOPY WITH PROPOFOL (N/A )  Patient location during evaluation: PACU Anesthesia Type: General Level of consciousness: awake and alert Pain management: pain level controlled Vital Signs Assessment: post-procedure vital signs reviewed and stable Respiratory status: spontaneous breathing, nonlabored ventilation, respiratory function stable and patient connected to nasal cannula oxygen Cardiovascular status: blood pressure returned to baseline and stable Postop Assessment: no apparent nausea or vomiting Anesthetic complications: no     Last Vitals:  Vitals:   02/09/19 0912 02/09/19 0922  BP: 132/83 139/90  Pulse: 70 71  Resp: 16 (!) 21  Temp:    SpO2: 99% 100%    Last Pain:  Vitals:   02/09/19 0922  TempSrc:   PainSc: 0-No pain                 Molli Barrows

## 2019-03-21 ENCOUNTER — Ambulatory Visit
Admission: RE | Admit: 2019-03-21 | Discharge: 2019-03-21 | Disposition: A | Discharge status: Home / Self Care | Payer: 59 | Source: Ambulatory Visit | Attending: Family Medicine | Admitting: Family Medicine

## 2019-03-21 DIAGNOSIS — Z1231 Encounter for screening mammogram for malignant neoplasm of breast: Secondary | ICD-10-CM | POA: Diagnosis not present

## 2019-03-22 ENCOUNTER — Telehealth: Payer: Self-pay

## 2019-03-22 NOTE — Telephone Encounter (Signed)
-----   Message from Virginia Crews, MD sent at 03/22/2019 12:28 PM EST ----- Normal mammogram. Repeat in 1 yr

## 2019-03-22 NOTE — Telephone Encounter (Signed)
Comment seen by patient Kaylee Jones on 03/22/2019 12:29 PM EST

## 2019-03-27 ENCOUNTER — Ambulatory Visit: Payer: 59 | Admitting: Gastroenterology

## 2019-03-28 ENCOUNTER — Ambulatory Visit: Payer: 59 | Admitting: Gastroenterology

## 2019-09-20 ENCOUNTER — Telehealth: Payer: Self-pay

## 2019-09-20 NOTE — Telephone Encounter (Signed)
Copied from Terry [phone]. Topic: Appointment Scheduling - Scheduling Inquiry for Clinic >> Sep 20, 2019 11:24 AM Oneta Rack wrote: Reason for CRM:   Patient would like to schedule an appointment to discuss her feeling tired, patient requested to see Dr. Rosanna Randy in Dr. Brita Romp absence, offered other physicians in the clinic  patient declined until she hears back from Dr. Rosanna Randy

## 2019-09-21 NOTE — Telephone Encounter (Signed)
Patient advised and scheduled for an appointment for 7/28.

## 2019-09-21 NOTE — Telephone Encounter (Signed)
I do not have any appointments available this week.

## 2019-09-22 NOTE — Progress Notes (Signed)
Established patient visit  I,[name],acting as a scribe for Wilhemena Durie, MD.,have documented all relevant documentation on the behalf of Wilhemena Durie, MD,as directed by  Wilhemena Durie, MD while in the presence of Wilhemena Durie, MD.   Patient: Kaylee Jones   DOB: 1960-09-27   59 y.o. Female  MRN: [id number] Visit Date: 09/27/2019  Today's healthcare provider: Wilhemena Durie, MD   Chief Complaint  Patient presents with  . Fatigue   Subjective    HPI  Patient states she has had extreme fatigue for several months. Patient states she feels like she is dragging all the time. Patient states she gets up to 8 hours per night. Patient states she feels tried and feels like she could go to sleep.  She does snore some. No chest pain or shortness of breath and no neurologic symptoms.      Medications: Outpatient Medications Prior to Visit  Medication Sig  . acetaminophen (TYLENOL) 325 MG tablet Take 650 mg by mouth every 4 (four) hours as needed.  . ALPRAZolam (XANAX) 0.25 MG tablet Take 0.25 mg by mouth as needed for anxiety.  . DOCOSAHEXAENOIC ACID-EPA PO Take by mouth daily. FISH OIL  . hydrocortisone (ANUSOL-HC) 25 MG suppository Place 25 mg rectally 2 (two) times daily. For 14 days  . ibuprofen (ADVIL) 800 MG tablet Take 800 mg by mouth every 6 (six) hours as needed.  . Psyllium (METAMUCIL PO) Take by mouth.   No facility-administered medications prior to visit.    Review of Systems  Constitutional: Negative for appetite change, chills, fatigue and fever.  Respiratory: Negative for chest tightness and shortness of breath.   Cardiovascular: Negative for chest pain and palpitations.  Gastrointestinal: Negative for abdominal pain, nausea and vomiting.  Neurological: Negative for dizziness and weakness.       Objective    BP 127/85 (BP Location: Left Arm, Patient Position: Sitting, Cuff Size: Large)   Pulse 83   Temp 97.6 F (36.4 C) (Other  (Comment))   Resp 16   Ht 5\' 1"  (1.549 m)   Wt (!) 226 lb (102.5 kg)   LMP 04/06/2016   SpO2 97%   BMI 42.70 kg/m  Wt Readings from Last 3 Encounters:  09/27/19 (!) 226 lb (102.5 kg)  02/09/19 209 lb (94.8 kg)  01/13/19 212 lb 12.8 oz (96.5 kg)      Results of the Epworth flowsheet 09/27/2019  Sitting and reading 0  Watching TV 1  Sitting, inactive in a public place (e.g. a theatre or a meeting) 0  As a passenger in a car for an hour without a break 2  Lying down to rest in the afternoon when circumstances permit 1  Sitting and talking to someone 0  Sitting quietly after a lunch without alcohol 0  In a car, while stopped for a few minutes in traffic 0  Total score 4   GAD 7 : Generalized Anxiety Score 09/27/2019  Nervous, Anxious, on Edge 0  Control/stop worrying 1  Worry too much - different things 0  Trouble relaxing 0  Restless 0  Easily annoyed or irritable 0  Afraid - awful might happen 1  Total GAD 7 Score 2  Anxiety Difficulty Not difficult at all   Depression screen [facility] 2/9 09/27/2019 01/13/2019 02/12/2017  Decreased Interest 0 0 0  Down, Depressed, Hopeless 0 0 0  PHQ - 2 Score 0 0 0  Altered sleeping 1 0 -  Tired, decreased energy 3 0 -  Change in appetite 0 0 -  Feeling bad or failure about yourself  0 0 -  Trouble concentrating 0 0 -  Moving slowly or fidgety/restless 0 0 -  Suicidal thoughts 0 0 -  PHQ-9 Score 4 0 -  Difficult doing work/chores Not difficult at all Not difficult at all -     Physical Exam Vitals reviewed.  Constitutional:      General: She is not in acute distress.    Appearance: Normal appearance. She is well-developed. She is obese. She is not diaphoretic.  HENT:     Head: Normocephalic and atraumatic.     Right Ear: Tympanic membrane, ear canal and external ear normal.     Left Ear: Tympanic membrane, ear canal and external ear normal.  Eyes:     General: No scleral icterus.    Conjunctiva/sclera: Conjunctivae normal.      Pupils: Pupils are equal, round, and reactive to light.  Neck:     Thyroid: No thyromegaly.  Cardiovascular:     Rate and Rhythm: Normal rate and regular rhythm.     Pulses: Normal pulses.     Heart sounds: Normal heart sounds. No murmur heard.   Pulmonary:     Effort: Pulmonary effort is normal. No respiratory distress.     Breath sounds: Normal breath sounds. No wheezing or rales.  Abdominal:     General: There is no distension.     Palpations: Abdomen is soft.     Tenderness: There is no abdominal tenderness.  Musculoskeletal:        General: No deformity.     Cervical back: Neck supple.     Right lower leg: No edema.     Left lower leg: No edema.  Lymphadenopathy:     Cervical: No cervical adenopathy.  Skin:    General: Skin is warm and dry.     Capillary Refill: Capillary refill takes less than 2 seconds.     Findings: No rash.  Neurological:     Mental Status: She is alert and oriented to person, place, and time. Mental status is at baseline.  Psychiatric:        Mood and Affect: Mood normal.        Behavior: Behavior normal.        Thought Content: Thought content normal.     BP 127/85 (BP Location: Left Arm, Patient Position: Sitting, Cuff Size: Large)   Pulse 83   Temp 97.6 F (36.4 C) (Other (Comment))   Resp 16   Ht 5\' 1"  (1.549 m)   Wt (!) 226 lb (102.5 kg)   LMP 04/06/2016   SpO2 97%   BMI 42.70 kg/m   General Appearance:    Alert, cooperative, no distress, appears stated age  Head:    Normocephalic, without obvious abnormality, atraumatic  Eyes:    PERRL, conjunctiva/corneas clear, EOM's intact, fundi    benign, both eyes  Ears:    Normal TM's and external ear canals, both ears  Nose:   Nares normal, septum midline, mucosa normal, no drainage    or sinus tenderness  Throat:   Lips, mucosa, and tongue normal; teeth and gums normal  Neck:   Supple, symmetrical, trachea midline, no adenopathy;    thyroid:  no enlargement/tenderness/nodules; no  carotid   bruit or JVD  Back:     Symmetric, no curvature, ROM normal, no CVA tenderness  Lungs:     Clear to auscultation  bilaterally, respirations unlabored  Chest Wall:    No tenderness or deformity   Heart:    Regular rate and rhythm, S1 and S2 normal, no murmur, rub   or gallop  Breast Exam:    No tenderness, masses, or nipple abnormality  Abdomen:     Soft, non-tender, bowel sounds active all four quadrants,    no masses, no organomegaly  Genitalia:    Normal female without lesion, discharge or tenderness  Rectal:    Normal tone, normal prostate, no masses or tenderness;   guaiac negative stool  Extremities:   Extremities normal, atraumatic, no cyanosis or edema  Pulses:   2+ and symmetric all extremities  Skin:   Skin color, texture, turgor normal, no rashes or lesions  Lymph nodes:   Cervical, supraclavicular, and axillary nodes normal  Neurologic:   CNII-XII intact, normal strength, sensation and reflexes    throughout   PHQ-9 is 4 GAD-7 score is 2, Epworth is 4  No results found for any visits on 09/27/19.  Assessment & Plan     1. Fatigue, unspecified type Could be OSA.  Baseline lab letter for anemia B12 and vitamin D deficiency and diabetes.  Consider home sleep study. - Home sleep test - CBC w/Diff/Platelet - Comprehensive Metabolic Panel (CMET) - TSH - Lipid panel - Vitamin B12 - VITAMIN D 25 Hydroxy (Vit-D Deficiency, Fractures)  2. Mixed hyperlipidemia Follow-up labs. - CBC w/Diff/Platelet - Comprehensive Metabolic Panel (CMET) - TSH - Lipid panel - Vitamin B12 - VITAMIN D 25 Hydroxy (Vit-D Deficiency, Fractures) 3.  Obesity Lifestyle stressed.  Follow-up 2 to 3 weeks.  No follow-ups on file.         Gerardine Peltz Cranford Mon, MD  [facility] [phone] (phone) [phone] (fax)  [place]

## 2019-09-27 ENCOUNTER — Ambulatory Visit (INDEPENDENT_AMBULATORY_CARE_PROVIDER_SITE_OTHER): Payer: 59 | Admitting: Family Medicine

## 2019-09-27 ENCOUNTER — Other Ambulatory Visit: Payer: Self-pay

## 2019-09-27 ENCOUNTER — Encounter: Payer: Self-pay | Admitting: Family Medicine

## 2019-09-27 VITALS — BP 127/85 | HR 83 | Temp 97.6°F | Resp 16 | Ht 61.0 in | Wt 226.0 lb

## 2019-09-27 DIAGNOSIS — R5383 Other fatigue: Secondary | ICD-10-CM | POA: Diagnosis not present

## 2019-09-27 DIAGNOSIS — E782 Mixed hyperlipidemia: Secondary | ICD-10-CM | POA: Diagnosis not present

## 2019-09-28 DIAGNOSIS — R5383 Other fatigue: Secondary | ICD-10-CM | POA: Diagnosis not present

## 2019-09-28 DIAGNOSIS — E782 Mixed hyperlipidemia: Secondary | ICD-10-CM | POA: Diagnosis not present

## 2019-09-29 LAB — COMPREHENSIVE METABOLIC PANEL
ALT: 16 IU/L (ref 0–32)
AST: 21 IU/L (ref 0–40)
Albumin/Globulin Ratio: 1.5 (ref 1.2–2.2)
Albumin: 4.3 g/dL (ref 3.8–4.9)
Alkaline Phosphatase: 114 IU/L (ref 48–121)
BUN/Creatinine Ratio: 10 (ref 9–23)
BUN: 9 mg/dL (ref 6–24)
Bilirubin Total: 0.6 mg/dL (ref 0.0–1.2)
CO2: 22 mmol/L (ref 20–29)
Calcium: 9.5 mg/dL (ref 8.7–10.2)
Chloride: 104 mmol/L (ref 96–106)
Creatinine, Ser: 0.88 mg/dL (ref 0.57–1.00)
GFR calc Af Amer: 84 mL/min/{1.73_m2} (ref >59)
GFR calc non Af Amer: 73 mL/min/{1.73_m2} (ref >59)
Globulin, Total: 2.9 g/dL (ref 1.5–4.5)
Glucose: 111 mg/dL — ABNORMAL HIGH (ref 65–99)
Potassium: 5.1 mmol/L (ref 3.5–5.2)
Sodium: 141 mmol/L (ref 134–144)
Total Protein: 7.2 g/dL (ref 6.0–8.5)

## 2019-09-29 LAB — CBC WITH DIFFERENTIAL/PLATELET
Basophils Absolute: 0 10*3/uL (ref 0.0–0.2)
Basos: 0 %
EOS (ABSOLUTE): 0 10*3/uL (ref 0.0–0.4)
Eos: 0 %
Hematocrit: 38.6 % (ref 34.0–46.6)
Hemoglobin: 12.7 g/dL (ref 11.1–15.9)
Immature Grans (Abs): 0.1 10*3/uL (ref 0.0–0.1)
Immature Granulocytes: 1 %
Lymphocytes Absolute: 2.2 10*3/uL (ref 0.7–3.1)
Lymphs: 24 %
MCH: 27.8 pg (ref 26.6–33.0)
MCHC: 32.9 g/dL (ref 31.5–35.7)
MCV: 85 fL (ref 79–97)
Monocytes Absolute: 0.5 10*3/uL (ref 0.1–0.9)
Monocytes: 6 %
Neutrophils Absolute: 6.3 10*3/uL (ref 1.4–7.0)
Neutrophils: 69 %
Platelets: 363 10*3/uL (ref 150–450)
RBC: 4.57 x10E6/uL (ref 3.77–5.28)
RDW: 13.7 % (ref 11.7–15.4)
WBC: 9.1 10*3/uL (ref 3.4–10.8)

## 2019-09-29 LAB — TSH: TSH: 2.68 u[IU]/mL (ref 0.450–4.500)

## 2019-09-29 LAB — LIPID PANEL
Chol/HDL Ratio: 4.8 ratio — ABNORMAL HIGH (ref 0.0–4.4)
Cholesterol, Total: 256 mg/dL — ABNORMAL HIGH (ref 100–199)
HDL: 53 mg/dL (ref >39)
LDL Chol Calc (NIH): 183 mg/dL — ABNORMAL HIGH (ref 0–99)
Triglycerides: 112 mg/dL (ref 0–149)
VLDL Cholesterol Cal: 20 mg/dL (ref 5–40)

## 2019-09-29 LAB — VITAMIN D 25 HYDROXY (VIT D DEFICIENCY, FRACTURES): Vit D, 25-Hydroxy: 18.1 ng/mL — ABNORMAL LOW (ref 30.0–100.0)

## 2019-09-29 LAB — VITAMIN B12: Vitamin B-12: 127 pg/mL — ABNORMAL LOW (ref 232–1245)

## 2019-10-02 ENCOUNTER — Encounter: Payer: Self-pay | Admitting: Family Medicine

## 2019-10-12 ENCOUNTER — Other Ambulatory Visit: Payer: Self-pay | Admitting: *Deleted

## 2019-10-12 ENCOUNTER — Other Ambulatory Visit: Payer: Self-pay | Admitting: Family Medicine

## 2019-10-12 DIAGNOSIS — E538 Deficiency of other specified B group vitamins: Secondary | ICD-10-CM

## 2019-10-12 MED ORDER — CYANOCOBALAMIN 1000 MCG/ML IJ SOLN: INTRAMUSCULAR | 12 refills | Status: DC

## 2019-10-12 NOTE — Telephone Encounter (Signed)
Rx was sent to pharmacy. 

## 2019-10-17 NOTE — Progress Notes (Signed)
I,[name],acting as a scribe for Wilhemena Durie, MD.,have documented all relevant documentation on the behalf of Wilhemena Durie, MD,as directed by  Wilhemena Durie, MD while in the presence of Wilhemena Durie, MD.   Established patient visit   Patient: Kaylee Jones   DOB: 02/17/1961   59 y.o. Female  MRN: [id number] Visit Date: 10/18/2019  Today's healthcare provider: Wilhemena Durie, MD   Chief Complaint  Patient presents with   Follow-up   Subjective    HPI  Condition unchanged but patient is feeling upbeat.  She is just started B12 shots. Fatigue, unspecified type From 09/27/2019-Could be OSA.  Baseline lab letter for anemia B12 and vitamin D deficiency and diabetes.  Consider home sleep study. Labs checked showing-Labs okay but B12 and vitamin D both low. Advised to start vitamin D 2000 units over-the-counter daily. Started B12 injections once a week and then once a month.  Obesity From 09/27/2019-Lifestyle stressed.  Follow-up 2 to 3 weeks.      Medications: Outpatient Medications Prior to Visit  Medication Sig   acetaminophen (TYLENOL) 325 MG tablet Take 650 mg by mouth every 4 (four) hours as needed.   ALPRAZolam (XANAX) 0.25 MG tablet Take 0.25 mg by mouth as needed for anxiety.   Cholecalciferol (D-3-5) 125 MCG (5000 UT) capsule    cyanocobalamin (,VITAMIN B-12,) 1000 MCG/ML injection Injection 1 mL into the muscle once weekly for 4 weeks. Than once monthly.   DOCOSAHEXAENOIC ACID-EPA PO Take by mouth daily. FISH OIL   ibuprofen (ADVIL) 800 MG tablet Take 800 mg by mouth every 6 (six) hours as needed.   Psyllium (METAMUCIL PO) Take by mouth.   hydrocortisone (ANUSOL-HC) 25 MG suppository Place 25 mg rectally 2 (two) times daily. For 14 days   No facility-administered medications prior to visit.    Review of Systems  Constitutional: Negative for appetite change, chills, fatigue and fever.  Respiratory: Negative for chest  tightness and shortness of breath.   Cardiovascular: Negative for chest pain and palpitations.  Gastrointestinal: Negative for abdominal pain, nausea and vomiting.  Neurological: Negative for dizziness and weakness.       Objective    BP 124/82 (BP Location: Left Arm, Patient Position: Sitting, Cuff Size: Large)    Pulse 80    Temp 98.6 F (37 C) (Oral)    Resp 16    Ht 5\' 1"  (1.549 m)    Wt 227 lb (103 kg)    LMP 04/06/2016    SpO2 97%    BMI 42.89 kg/m  BP Readings from Last 3 Encounters:  10/18/19 124/82  09/27/19 127/85  02/09/19 139/90   Wt Readings from Last 3 Encounters:  10/18/19 227 lb (103 kg)  09/27/19 (!) 226 lb (102.5 kg)  02/09/19 209 lb (94.8 kg)      Physical Exam Vitals reviewed.  Constitutional:      General: She is not in acute distress.    Appearance: Normal appearance. She is well-developed. She is obese. She is not diaphoretic.  HENT:     Head: Normocephalic and atraumatic.     Right Ear: Tympanic membrane, ear canal and external ear normal.     Left Ear: Tympanic membrane, ear canal and external ear normal.  Eyes:     General: No scleral icterus.    Conjunctiva/sclera: Conjunctivae normal.     Pupils: Pupils are equal, round, and reactive to light.  Neck:     Thyroid: No thyromegaly.  Cardiovascular:     Rate and Rhythm: Normal rate and regular rhythm.     Pulses: Normal pulses.     Heart sounds: Normal heart sounds. No murmur heard.   Pulmonary:     Effort: Pulmonary effort is normal. No respiratory distress.     Breath sounds: Normal breath sounds. No wheezing or rales.  Abdominal:     General: There is no distension.     Palpations: Abdomen is soft.     Tenderness: There is no abdominal tenderness.  Musculoskeletal:        General: No deformity.     Cervical back: Neck supple.     Right lower leg: No edema.     Left lower leg: No edema.  Lymphadenopathy:     Cervical: No cervical adenopathy.  Skin:    General: Skin is warm and dry.       Capillary Refill: Capillary refill takes less than 2 seconds.     Findings: No rash.  Neurological:     Mental Status: She is alert and oriented to person, place, and time. Mental status is at baseline.  Psychiatric:        Mood and Affect: Mood normal.        Behavior: Behavior normal.        Thought Content: Thought content normal.       No results found for any visits on 10/18/19.  Assessment & Plan     1. Fatigue, unspecified type B12 and D deficiencies are being treated  2. Morbid obesity ([place])   3. Mixed hyperlipidemia   4. B12 deficiency   5. Vitamin D deficiency    No follow-ups on file.         Garlon Tuggle Cranford Mon, MD  [facility] [phone] (phone) [phone] (fax)  [place]

## 2019-10-18 ENCOUNTER — Encounter: Payer: Self-pay | Admitting: Family Medicine

## 2019-10-18 ENCOUNTER — Other Ambulatory Visit: Payer: Self-pay

## 2019-10-18 ENCOUNTER — Ambulatory Visit (INDEPENDENT_AMBULATORY_CARE_PROVIDER_SITE_OTHER): Payer: 59 | Admitting: Family Medicine

## 2019-10-18 VITALS — BP 124/82 | HR 80 | Temp 98.6°F | Resp 16 | Ht 61.0 in | Wt 227.0 lb

## 2019-10-18 DIAGNOSIS — E559 Vitamin D deficiency, unspecified: Secondary | ICD-10-CM | POA: Diagnosis not present

## 2019-10-18 DIAGNOSIS — R5383 Other fatigue: Secondary | ICD-10-CM | POA: Diagnosis not present

## 2019-10-18 DIAGNOSIS — E782 Mixed hyperlipidemia: Secondary | ICD-10-CM | POA: Diagnosis not present

## 2019-10-18 DIAGNOSIS — E538 Deficiency of other specified B group vitamins: Secondary | ICD-10-CM | POA: Diagnosis not present

## 2019-12-01 ENCOUNTER — Encounter: Payer: Self-pay | Admitting: Family Medicine

## 2019-12-06 ENCOUNTER — Other Ambulatory Visit: Payer: Self-pay | Admitting: *Deleted

## 2019-12-06 DIAGNOSIS — E538 Deficiency of other specified B group vitamins: Secondary | ICD-10-CM

## 2019-12-06 MED ORDER — CYANOCOBALAMIN 1000 MCG/ML IJ SOLN: INTRAMUSCULAR | 12 refills | Status: DC

## 2020-01-15 ENCOUNTER — Encounter: Payer: Self-pay | Admitting: Family Medicine

## 2020-01-18 ENCOUNTER — Other Ambulatory Visit: Payer: Self-pay

## 2020-01-18 ENCOUNTER — Ambulatory Visit (INDEPENDENT_AMBULATORY_CARE_PROVIDER_SITE_OTHER): Payer: 59 | Admitting: Family Medicine

## 2020-01-18 ENCOUNTER — Encounter: Payer: Self-pay | Admitting: Family Medicine

## 2020-01-18 VITALS — BP 127/75 | HR 78 | Temp 99.0°F | Resp 16 | Ht 61.0 in | Wt 222.0 lb

## 2020-01-18 DIAGNOSIS — G8929 Other chronic pain: Secondary | ICD-10-CM

## 2020-01-18 DIAGNOSIS — E782 Mixed hyperlipidemia: Secondary | ICD-10-CM

## 2020-01-18 DIAGNOSIS — R5383 Other fatigue: Secondary | ICD-10-CM

## 2020-01-18 DIAGNOSIS — E538 Deficiency of other specified B group vitamins: Secondary | ICD-10-CM | POA: Diagnosis not present

## 2020-01-18 DIAGNOSIS — K649 Unspecified hemorrhoids: Secondary | ICD-10-CM

## 2020-01-18 DIAGNOSIS — M5441 Lumbago with sciatica, right side: Secondary | ICD-10-CM

## 2020-01-18 NOTE — Progress Notes (Signed)
I,Kaylee Jones,acting as a scribe for Kaylee Durie, MD.,have documented all relevant documentation on the behalf of Kaylee Durie, MD,as directed by  Kaylee Durie, MD while in the presence of Kaylee Durie, MD.   Established patient visit   Patient: Kaylee Jones   DOB: 1960/05/29   59 y.o. Female  MRN: [id number] Visit Date: 01/18/2020  Today's healthcare provider: Wilhemena Durie, MD   Chief Complaint  Patient presents with  . Follow-up   Subjective    HPI  Patient is feeling good bit better since B12 shots have been given.  Energy.  Feels well. He is having some right buttocks pain Fatigue, unspecified type From 10/18/2019-B12 and D deficiencies are being treated.      Medications: Outpatient Medications Prior to Visit  Medication Sig  . acetaminophen (TYLENOL) 325 MG tablet Take 650 mg by mouth every 4 (four) hours as needed.  . ALPRAZolam (XANAX) 0.25 MG tablet Take 0.25 mg by mouth as needed for anxiety.  . Cholecalciferol (D-3-5) 125 MCG (5000 UT) capsule   . cyanocobalamin (,VITAMIN B-12,) 1000 MCG/ML injection Injection 1 mL into the muscle once weekly for 4 weeks. Than once monthly.  . DOCOSAHEXAENOIC ACID-EPA PO Take by mouth daily. FISH OIL  . ibuprofen (ADVIL) 800 MG tablet Take 800 mg by mouth every 6 (six) hours as needed.  . hydrocortisone (ANUSOL-HC) 25 MG suppository Place 25 mg rectally 2 (two) times daily. For 14 days  . Psyllium (METAMUCIL PO) Take by mouth.   No facility-administered medications prior to visit.    Review of Systems  Constitutional: Negative for appetite change, chills, fatigue and fever.  Respiratory: Negative for chest tightness and shortness of breath.   Cardiovascular: Negative for chest pain and palpitations.  Gastrointestinal: Negative for abdominal pain, nausea and vomiting.  Neurological: Negative for dizziness and weakness.       Objective    BP 127/75 (BP Location: Right Arm, Patient  Position: Sitting, Cuff Size: Large)   Pulse 78   Temp 99 F (37.2 C) (Oral)   Resp 16   Ht 5\' 1"  (1.549 m)   Wt 222 lb (100.7 kg)   LMP 04/06/2016   SpO2 100%   BMI 41.95 kg/m     Physical Exam Vitals reviewed.  Constitutional:      General: She is not in acute distress.    Appearance: Normal appearance. She is well-developed. She is obese. She is not diaphoretic.  HENT:     Head: Normocephalic and atraumatic.     Right Ear: Tympanic membrane, ear canal and external ear normal.     Left Ear: Tympanic membrane, ear canal and external ear normal.  Eyes:     General: No scleral icterus.    Conjunctiva/sclera: Conjunctivae normal.     Pupils: Pupils are equal, round, and reactive to light.  Neck:     Thyroid: No thyromegaly.  Cardiovascular:     Rate and Rhythm: Normal rate and regular rhythm.     Pulses: Normal pulses.     Heart sounds: Normal heart sounds. No murmur heard.   Pulmonary:     Effort: Pulmonary effort is normal. No respiratory distress.     Breath sounds: Normal breath sounds. No wheezing or rales.  Abdominal:     General: There is no distension.     Palpations: Abdomen is soft.     Tenderness: There is no abdominal tenderness.  Musculoskeletal:  General: No deformity.     Cervical back: Neck supple.     Right lower leg: No edema.     Left lower leg: No edema.  Lymphadenopathy:     Cervical: No cervical adenopathy.  Skin:    General: Skin is warm and dry.     Capillary Refill: Capillary refill takes less than 2 seconds.     Findings: No rash.  Neurological:     Mental Status: She is alert and oriented to person, place, and time. Mental status is at baseline.  Psychiatric:        Mood and Affect: Mood normal.        Behavior: Behavior normal.        Thought Content: Thought content normal.       No results found for any visits on 01/18/20.  Assessment & Plan     1. B12 deficiency We will check a B12 level sometime next year.  NU  monthly shot  2. Hemorrhoids, unspecified hemorrhoid type   3. Fatigue, unspecified type Proved with B12 treatment  4. Morbid obesity ([place])   5. Mixed hyperlipidemia   6. Chronic right-sided low back pain with right-sided sciatica She seems to have a sciatica that is positional.    No follow-ups on file.         Kaylee Jones Mon, MD  Kaylee Jones - [place] [phone] (phone) [phone] (fax)  Kaylee Jones

## 2020-01-18 NOTE — Patient Instructions (Signed)
Try OTC Voltaren gel or Capsaicin for joint/muscle pain.

## 2020-01-20 ENCOUNTER — Other Ambulatory Visit: Payer: Self-pay | Admitting: Family Medicine

## 2020-01-20 MED ORDER — HYDROCORTISONE ACETATE 25 MG RE SUPP: 25.0000 mg | Freq: Two times a day (BID) | RECTAL | 1 refills | Status: DC

## 2020-01-20 MED ORDER — ALPRAZOLAM 0.25 MG PO TABS: 0.2500 mg | ORAL_TABLET | ORAL | 2 refills | Status: DC | PRN

## 2020-04-18 NOTE — Progress Notes (Deleted)
      Established patient visit   Patient: Kaylee Jones   DOB: 02/14/1961   60 y.o. Female  MRN: [id number] Visit Date: 04/22/2020  Today's healthcare provider: Wilhemena Durie, MD   No chief complaint on file.  Subjective    HPI  Follow up for Vit. B12 deficiency   The patient was last seen for this 3 months ago. Changes made at last visit include no medication changes. Patient is to continue with injections.   She reports {excellent/good/fair/poor:19665} compliance with treatment. She feels that condition is {improved/worse/unchanged:3041574}. She {is/is not:21021397} having side effects. ***  -----------------------------------------------------------------------------------------  Chronic right-sided low back pain with right-sided sciatica From 01/18/2020- She seems to have a sciatica that is positional.   {Show patient history (optional):23778::" "}   Medications: Outpatient Medications Prior to Visit  Medication Sig  . acetaminophen (TYLENOL) 325 MG tablet Take 650 mg by mouth every 4 (four) hours as needed.  . ALPRAZolam (XANAX) 0.25 MG tablet Take 1 tablet (0.25 mg total) by mouth as needed for anxiety.  . Cholecalciferol (D-3-5) 125 MCG (5000 UT) capsule   . cyanocobalamin (,VITAMIN B-12,) 1000 MCG/ML injection Injection 1 mL into the muscle once weekly for 4 weeks. Than once monthly.  . DOCOSAHEXAENOIC ACID-EPA PO Take by mouth daily. FISH OIL  . hydrocortisone (ANUSOL-HC) 25 MG suppository Place 1 suppository (25 mg total) rectally 2 (two) times daily. For 14 days  . ibuprofen (ADVIL) 800 MG tablet Take 800 mg by mouth every 6 (six) hours as needed.  . Psyllium (METAMUCIL PO) Take by mouth.   No facility-administered medications prior to visit.    Review of Systems  {Labs  Heme  Chem  Endocrine  Serology  Results Review (optional):23779::" "}   Objective    LMP 04/06/2016  {Show previous vital signs (optional):23777::" "}   Physical Exam   ***  No results found for any visits on 04/22/20.  Assessment & Plan     ***  No follow-ups on file.      {provider attestation***:1}   Wilhemena Durie, MD  [facility] [phone] (phone) [phone] (fax)  [place]

## 2020-04-22 ENCOUNTER — Ambulatory Visit: Payer: 59 | Admitting: Family Medicine

## 2020-04-22 ENCOUNTER — Other Ambulatory Visit: Payer: Self-pay

## 2020-04-22 DIAGNOSIS — E538 Deficiency of other specified B group vitamins: Secondary | ICD-10-CM

## 2020-06-27 ENCOUNTER — Other Ambulatory Visit: Payer: Self-pay

## 2020-06-27 ENCOUNTER — Other Ambulatory Visit: Payer: Self-pay | Admitting: Family Medicine

## 2020-06-27 MED ORDER — HYDROCORTISONE ACETATE 25 MG RE SUPP
RECTAL | 1 refills | Status: DC
  Filled 2020-06-27: qty 12, 6d supply, fill #0
  Filled 2020-10-08: qty 12, 6d supply, fill #1

## 2020-06-27 NOTE — Telephone Encounter (Signed)
Requested medication (s) are due for refill today: no  Requested medication (s) are on the active medication list: yes  Last refill:  01/22/2020  Future visit scheduled: yes  Notes to clinic:  Medication not assigned to a protocol, review manually   Requested Prescriptions  Pending Prescriptions Disp Refills   hydrocortisone (ANUSOL-HC) 25 MG suppository 12 suppository 1    Sig: UNWRAP AND INSERT 1 SUPPOSITORY RECTALLY TWO TIMES DAILY FOR 14 DAYS      Off-Protocol Failed - 06/27/2020  9:32 AM      Failed - Medication not assigned to a protocol, review manually.      Passed - Valid encounter within last 12 months    Recent Outpatient Visits           5 months ago B12 deficiency   [facility] Jerrol Banana., MD   8 months ago Fatigue, unspecified type   [facility] Jerrol Banana., MD   9 months ago Fatigue, unspecified type   [facility] Jerrol Banana., MD   1 year ago Encounter for annual physical exam   [facility] [place], Dionne Bucy, MD   2 years ago [place] [place], [place]       Future Appointments             In 3 weeks Jerrol Banana., MD [facility] [place]

## 2020-06-28 ENCOUNTER — Other Ambulatory Visit: Payer: Self-pay

## 2020-06-28 MED FILL — Alprazolam Tab 0.25 MG: ORAL | 30 days supply | Qty: 30 | Fill #0 | Status: AC

## 2020-06-28 MED FILL — Cyanocobalamin Inj 1000 MCG/ML: INTRAMUSCULAR | 28 days supply | Qty: 1 | Fill #0 | Status: AC

## 2020-07-18 ENCOUNTER — Other Ambulatory Visit (HOSPITAL_COMMUNITY)
Admission: RE | Admit: 2020-07-18 | Discharge: 2020-07-18 | Disposition: A | Discharge status: Home / Self Care | Payer: 59 | Source: Ambulatory Visit | Attending: Family Medicine | Admitting: Family Medicine

## 2020-07-18 ENCOUNTER — Encounter: Payer: Self-pay | Admitting: Family Medicine

## 2020-07-18 ENCOUNTER — Ambulatory Visit (INDEPENDENT_AMBULATORY_CARE_PROVIDER_SITE_OTHER): Payer: 59 | Admitting: Family Medicine

## 2020-07-18 ENCOUNTER — Other Ambulatory Visit: Payer: Self-pay

## 2020-07-18 VITALS — BP 122/76 | HR 80 | Temp 97.3°F | Resp 18 | Ht 61.0 in | Wt 184.0 lb

## 2020-07-18 DIAGNOSIS — Z0000 Encounter for general adult medical examination without abnormal findings: Secondary | ICD-10-CM

## 2020-07-18 DIAGNOSIS — Z23 Encounter for immunization: Secondary | ICD-10-CM

## 2020-07-18 DIAGNOSIS — E559 Vitamin D deficiency, unspecified: Secondary | ICD-10-CM | POA: Diagnosis not present

## 2020-07-18 DIAGNOSIS — E782 Mixed hyperlipidemia: Secondary | ICD-10-CM

## 2020-07-18 DIAGNOSIS — Z124 Encounter for screening for malignant neoplasm of cervix: Secondary | ICD-10-CM | POA: Insufficient documentation

## 2020-07-18 NOTE — Progress Notes (Signed)
I,Roshena L Chambers,acting as a scribe for Wilhemena Durie, MD.,have documented all relevant documentation on the behalf of Wilhemena Durie, MD,as directed by  Wilhemena Durie, MD while in the presence of Wilhemena Durie, MD.   Complete physical exam   Patient: Kaylee Jones   DOB: 08/02/1960   60 y.o. Female  MRN: [id number] Visit Date: 07/18/2020  Today's healthcare provider: Wilhemena Durie, MD   Chief Complaint  Patient presents with  . Annual Exam   Subjective    Kaylee Jones is a 60 y.o. female who presents today for a complete physical exam.  She reports consuming a general diet. The patient does not participate in regular exercise at present. She generally feels fairly well. She reports sleeping fairly well. She does not have additional problems to discuss today.  Patient has 3 children and 2 stepchildren and 12 grandchildren.  She feels well.  Last pap smear: 03/18/2018- Normal/ HPV negative Last Mammogram: 03/21/2019 BI-RADS CAT I Last Colonoscopy: 02/09/2019 non bleeding internal hemorrhoids, colon polyps, diverticulosis- should repeat in 5 years  HPI    Past Medical History:  Diagnosis Date  . Anal fissure 08/2018  . Anxiety   . GERD (gastroesophageal reflux disease)    Past Surgical History:  Procedure Laterality Date  . COLONOSCOPY WITH PROPOFOL N/A 02/09/2019   Procedure: COLONOSCOPY WITH PROPOFOL;  Surgeon: Toledo, Benay Pike, MD;  Location: [facility] ENDOSCOPY;  Service: Gastroenterology;  Laterality: N/A;  . LAPAROSCOPIC CHOLECYSTECTOMY    . TONSILLECTOMY    . TUBAL LIGATION     Social History   Socioeconomic History  . Marital status: Married    Spouse name: Not on file  . Number of children: Not on file  . Years of education: Not on file  . Highest education level: Not on file  Occupational History  . Occupation: Forensic scientist of pain management  Tobacco Use  . Smoking status: Never Smoker  . Smokeless tobacco: Never Used   Vaping Use  . Vaping Use: Never used  Substance and Sexual Activity  . Alcohol use: Yes    Alcohol/week: 0.0 standard drinks    Comment: rare  . Drug use: No  . Sexual activity: Not on file  Other Topics Concern  . Not on file  Social History Narrative  . Not on file   Social Determinants of Health   Financial Resource Strain: Not on file  Food Insecurity: Not on file  Transportation Needs: Not on file  Physical Activity: Not on file  Stress: Not on file  Social Connections: Not on file  Intimate Partner Violence: Not on file   Family Status  Relation Name Status  . Mother  Alive  . Father  Deceased  . Neg Hx  (Not Specified)   Family History  Problem Relation Age of Onset  . Hypertension Mother   . Esophageal varices Father   . Breast cancer Neg Hx    Allergies  Allergen Reactions  . Codeine     Patient reports she hallucinates     Patient Care Team: Virginia Crews, MD as PCP - General (Family Medicine)   Medications: Outpatient Medications Prior to Visit  Medication Sig  . acetaminophen (TYLENOL) 325 MG tablet Take 650 mg by mouth every 4 (four) hours as needed.  . ALPRAZolam (XANAX) 0.25 MG tablet Take 1 tablet (0.25 mg total) by mouth as needed for anxiety.  . ALPRAZolam (XANAX) 0.25 MG tablet TAKE 1 TABLET  BY MOUTH AS NEEDED FOR ANXIETY  . Cholecalciferol (D-3-5) 125 MCG (5000 UT) capsule   . cyanocobalamin (,VITAMIN B-12,) 1000 MCG/ML injection Injection 1 mL into the muscle once weekly for 4 weeks. Than once monthly.  . cyanocobalamin (,VITAMIN B-12,) 1000 MCG/ML injection INJECTION 1 ML INTO THE MUSCLE ONCE WEEKLY FOR 4 WEEKS. THEN ONCE MONTHLY.  . hydrocortisone (ANUSOL-HC) 25 MG suppository UNWRAP AND INSERT 1 SUPPOSITORY RECTALLY TWO TIMES DAILY FOR 14 DAYS  . ibuprofen (ADVIL) 800 MG tablet Take 800 mg by mouth every 6 (six) hours as needed.  . [DISCONTINUED] DOCOSAHEXAENOIC ACID-EPA PO Take by mouth daily. FISH OIL  . [DISCONTINUED]  hydrocortisone (ANUSOL-HC) 25 MG suppository Place 1 suppository (25 mg total) rectally 2 (two) times daily. For 14 days  . [DISCONTINUED] Psyllium (METAMUCIL PO) Take by mouth.   No facility-administered medications prior to visit.    Review of Systems  Constitutional: Negative for chills, fatigue and fever.  HENT: Negative for congestion, ear pain, rhinorrhea, sneezing and sore throat.   Eyes: Negative.  Negative for pain and redness.  Respiratory: Negative for cough, shortness of breath and wheezing.   Cardiovascular: Negative for chest pain and leg swelling.  Gastrointestinal: Positive for anal bleeding and rectal pain. Negative for abdominal pain, blood in stool, constipation, diarrhea and nausea.  Endocrine: Negative for polydipsia and polyphagia.  Genitourinary: Negative.  Negative for dysuria, flank pain, hematuria, pelvic pain, vaginal bleeding and vaginal discharge.  Musculoskeletal: Positive for arthralgias (right hip pain). Negative for back pain, gait problem and joint swelling.  Skin: Negative for rash.  Neurological: Negative.  Negative for dizziness, tremors, seizures, weakness, light-headedness, numbness and headaches.  Hematological: Negative for adenopathy.  Psychiatric/Behavioral: Negative.  Negative for behavioral problems, confusion and dysphoric mood. The patient is not nervous/anxious and is not hyperactive.        Objective    BP 122/76 (BP Location: Left Arm, Patient Position: Sitting, Cuff Size: Normal)   Pulse 80   Temp (!) 97.3 F (36.3 C) (Temporal)   Resp 18   Ht 5\' 1"  (1.549 m)   Wt 184 lb (83.5 kg)   LMP 04/06/2016   BMI 34.77 kg/m  BP Readings from Last 3 Encounters:  07/18/20 122/76  01/18/20 127/75  10/18/19 124/82   Wt Readings from Last 3 Encounters:  07/18/20 184 lb (83.5 kg)  01/18/20 222 lb (100.7 kg)  10/18/19 227 lb (103 kg)      Physical Exam Vitals reviewed.  Constitutional:      General: She is not in acute distress.     Appearance: Normal appearance. She is well-developed. She is not diaphoretic.  HENT:     Head: Normocephalic and atraumatic.     Right Ear: Tympanic membrane, ear canal and external ear normal.     Left Ear: Tympanic membrane, ear canal and external ear normal.  Eyes:     General: No scleral icterus.    Conjunctiva/sclera: Conjunctivae normal.     Pupils: Pupils are equal, round, and reactive to light.  Neck:     Thyroid: No thyromegaly.  Cardiovascular:     Rate and Rhythm: Normal rate and regular rhythm.     Pulses: Normal pulses.     Heart sounds: Normal heart sounds. No murmur heard.   Pulmonary:     Effort: Pulmonary effort is normal. No respiratory distress.     Breath sounds: Normal breath sounds. No wheezing or rales.  Abdominal:     General: There is no  distension.     Palpations: Abdomen is soft.     Tenderness: There is no abdominal tenderness.  Musculoskeletal:        General: No deformity.     Cervical back: Neck supple.     Right lower leg: No edema.     Left lower leg: No edema.  Lymphadenopathy:     Cervical: No cervical adenopathy.  Skin:    General: Skin is warm and dry.     Capillary Refill: Capillary refill takes less than 2 seconds.     Findings: No rash.  Neurological:     General: No focal deficit present.     Mental Status: She is alert and oriented to person, place, and time.  Psychiatric:        Mood and Affect: Mood normal.        Behavior: Behavior normal.        Thought Content: Thought content normal.        Judgment: Judgment normal.       Last depression screening scores PHQ 2/9 Scores 07/18/2020 09/27/2019 01/13/2019  PHQ - 2 Score 0 0 0  PHQ- 9 Score 1 4 0   Last fall risk screening Fall Risk  07/18/2020  Falls in the past year? 0  Number falls in past yr: 0  Injury with Fall? 0  Follow up Falls evaluation completed   Last Audit-C alcohol use screening No flowsheet data found. A score of 3 or more in women, and 4 or more in  men indicates increased risk for alcohol abuse, EXCEPT if all of the points are from question 1   No results found for any visits on 07/18/20.  Assessment & Plan    Routine Health Maintenance and Physical Exam  Exercise Activities and Dietary recommendations Goals   None     Immunization History  Administered Date(s) Administered  . Influenza-Unspecified 12/07/2018  . PFIZER(Purple Top)SARS-COV-2 Vaccination 02/27/2019, 03/20/2019  . Tdap 07/18/2020    Health Maintenance  Topic Date Due  . HIV Screening  Never done  . COVID-19 Vaccine (3 - Booster for Pfizer series) 08/18/2019  . INFLUENZA VACCINE  09/30/2020  . PAP SMEAR-Modifier  03/18/2021  . MAMMOGRAM  03/20/2021  . COLONOSCOPY (Pts 45-56yrs Insurance coverage will need to be confirmed)  02/08/2029  . TETANUS/TDAP  07/19/2030  . Hepatitis C Screening  Completed  . HPV VACCINES  Aged Out    Discussed health benefits of physical activity, and encouraged her to engage in regular exercise appropriate for her age and condition.  1. Encounter for annual physical exam  - CBC with Differential/Platelet - Comprehensive metabolic panel - TSH  2. Vitamin D deficiency  - VITAMIN D 25 Hydroxy (Vit-D Deficiency, Fractures)  3. Mixed hyperlipidemia  - CBC with Differential/Platelet - Comprehensive metabolic panel - Lipid panel  4. Screening for cervical cancer Repeat Pap with HPV in 5 years. - Cytology - PAP  5. Need for tetanus, diphtheria, and acellular pertussis (Tdap) vaccine in patient of adolescent age or older  - Administer Tetanus-diphtheria-acellular pertussis (Tdap) vaccine  Return in about 1 year (around 07/18/2021) for CPE.     I, Wilhemena Durie, MD, have reviewed all documentation for this visit. The documentation on 07/27/20 for the exam, diagnosis, procedures, and orders are all accurate and complete.    Mennie Spiller Cranford Mon, MD  [facility] [phone] (phone) [phone]  (fax)  [place]

## 2020-07-22 DIAGNOSIS — E559 Vitamin D deficiency, unspecified: Secondary | ICD-10-CM | POA: Diagnosis not present

## 2020-07-22 DIAGNOSIS — Z0000 Encounter for general adult medical examination without abnormal findings: Secondary | ICD-10-CM | POA: Diagnosis not present

## 2020-07-22 DIAGNOSIS — E782 Mixed hyperlipidemia: Secondary | ICD-10-CM | POA: Diagnosis not present

## 2020-07-23 LAB — CBC WITH DIFFERENTIAL/PLATELET
Basophils Absolute: 0 10*3/uL (ref 0.0–0.2)
Basos: 0 %
EOS (ABSOLUTE): 0 10*3/uL (ref 0.0–0.4)
Eos: 0 %
Hematocrit: 39 % (ref 34.0–46.6)
Hemoglobin: 12.6 g/dL (ref 11.1–15.9)
Immature Grans (Abs): 0 10*3/uL (ref 0.0–0.1)
Immature Granulocytes: 0 %
Lymphocytes Absolute: 2.2 10*3/uL (ref 0.7–3.1)
Lymphs: 19 %
MCH: 26.6 pg (ref 26.6–33.0)
MCHC: 32.3 g/dL (ref 31.5–35.7)
MCV: 82 fL (ref 79–97)
Monocytes Absolute: 0.5 10*3/uL (ref 0.1–0.9)
Monocytes: 5 %
Neutrophils Absolute: 8.6 10*3/uL — ABNORMAL HIGH (ref 1.4–7.0)
Neutrophils: 76 %
Platelets: 338 10*3/uL (ref 150–450)
RBC: 4.74 x10E6/uL (ref 3.77–5.28)
RDW: 14.5 % (ref 11.7–15.4)
WBC: 11.4 10*3/uL — ABNORMAL HIGH (ref 3.4–10.8)

## 2020-07-23 LAB — LIPID PANEL
Chol/HDL Ratio: 4.2 ratio (ref 0.0–4.4)
Cholesterol, Total: 224 mg/dL — ABNORMAL HIGH (ref 100–199)
HDL: 53 mg/dL (ref >39)
LDL Chol Calc (NIH): 159 mg/dL — ABNORMAL HIGH (ref 0–99)
Triglycerides: 67 mg/dL (ref 0–149)
VLDL Cholesterol Cal: 12 mg/dL (ref 5–40)

## 2020-07-23 LAB — COMPREHENSIVE METABOLIC PANEL
ALT: 14 IU/L (ref 0–32)
AST: 14 IU/L (ref 0–40)
Albumin/Globulin Ratio: 1.6 (ref 1.2–2.2)
Albumin: 4.6 g/dL (ref 3.8–4.9)
Alkaline Phosphatase: 119 IU/L (ref 44–121)
BUN/Creatinine Ratio: 19 (ref 9–23)
BUN: 16 mg/dL (ref 6–24)
Bilirubin Total: 0.2 mg/dL (ref 0.0–1.2)
CO2: 20 mmol/L (ref 20–29)
Calcium: 9.2 mg/dL (ref 8.7–10.2)
Chloride: 102 mmol/L (ref 96–106)
Creatinine, Ser: 0.83 mg/dL (ref 0.57–1.00)
Globulin, Total: 2.8 g/dL (ref 1.5–4.5)
Glucose: 100 mg/dL — ABNORMAL HIGH (ref 65–99)
Potassium: 5.2 mmol/L (ref 3.5–5.2)
Sodium: 141 mmol/L (ref 134–144)
Total Protein: 7.4 g/dL (ref 6.0–8.5)
eGFR: 81 mL/min/{1.73_m2} (ref >59)

## 2020-07-23 LAB — VITAMIN D 25 HYDROXY (VIT D DEFICIENCY, FRACTURES): Vit D, 25-Hydroxy: 49.2 ng/mL (ref 30.0–100.0)

## 2020-07-23 LAB — CYTOLOGY - PAP
Comment: NEGATIVE
Diagnosis: NEGATIVE
High risk HPV: NEGATIVE

## 2020-07-23 LAB — TSH: TSH: 3.29 u[IU]/mL (ref 0.450–4.500)

## 2020-10-02 ENCOUNTER — Other Ambulatory Visit: Payer: Self-pay

## 2020-10-02 MED ORDER — CARESTART COVID-19 HOME TEST VI KIT
PACK | 0 refills | Status: DC
  Filled 2020-10-02: qty 2, 4d supply, fill #0

## 2020-10-08 ENCOUNTER — Other Ambulatory Visit: Payer: Self-pay | Admitting: Family Medicine

## 2020-10-08 ENCOUNTER — Other Ambulatory Visit: Payer: Self-pay

## 2020-10-08 NOTE — Telephone Encounter (Signed)
Requested medication (s) are due for refill today: yes   Requested medication (s) are on the active medication list:yes   Future visit scheduled: yes   Notes to clinic:  Medication not assigned to a protocol, review manually   Requested Prescriptions  Pending Prescriptions Disp Refills   cyanocobalamin (,VITAMIN B-12,) 1000 MCG/ML injection 1 mL 12    Sig: INJECTION 1 ML INTO THE MUSCLE ONCE WEEKLY FOR 4 WEEKS. THEN ONCE MONTHLY.      Off-Protocol Failed - 10/08/2020  1:25 PM      Failed - Medication not assigned to a protocol, review manually.      Passed - Valid encounter within last 12 months    Recent Outpatient Visits           2 months ago Encounter for annual physical exam   [facility] Jerrol Banana., MD   8 months ago B12 deficiency   [facility] Jerrol Banana., MD   11 months ago Fatigue, unspecified type   [facility] Jerrol Banana., MD   1 year ago Fatigue, unspecified type   [facility] Jerrol Banana., MD   1 year ago Encounter for annual physical exam   [facility] Virginia Crews, MD       Future Appointments             In 9 months Jerrol Banana., MD [facility]            Off-Protocol Failed - 10/08/2020  1:25 PM      Failed - Medication not assigned to a protocol, review manually.      Passed - Valid encounter within last 12 months    Recent Outpatient Visits           2 months ago Encounter for annual physical exam   [facility] Jerrol Banana., MD   8 months ago B12 deficiency   [facility] Jerrol Banana., MD   11 months ago Fatigue, unspecified type   [facility] Jerrol Banana., MD   1 year ago Fatigue, unspecified type   [facility] Jerrol Banana., MD   1 year ago Encounter for annual physical exam   [facility] Virginia Crews, MD       Future Appointments             In 9 months Jerrol Banana., MD [facility] [place]

## 2020-10-09 ENCOUNTER — Other Ambulatory Visit: Payer: Self-pay

## 2020-10-09 MED FILL — Cyanocobalamin Inj 1000 MCG/ML: INTRAMUSCULAR | 28 days supply | Qty: 1 | Fill #0 | Status: AC

## 2020-10-28 DIAGNOSIS — S80862A Insect bite (nonvenomous), left lower leg, initial encounter: Secondary | ICD-10-CM | POA: Diagnosis not present

## 2020-10-28 DIAGNOSIS — K644 Residual hemorrhoidal skin tags: Secondary | ICD-10-CM | POA: Diagnosis not present

## 2020-10-28 DIAGNOSIS — X32XXXA Exposure to sunlight, initial encounter: Secondary | ICD-10-CM | POA: Diagnosis not present

## 2020-10-28 DIAGNOSIS — S80861A Insect bite (nonvenomous), right lower leg, initial encounter: Secondary | ICD-10-CM | POA: Diagnosis not present

## 2020-10-28 DIAGNOSIS — L570 Actinic keratosis: Secondary | ICD-10-CM | POA: Diagnosis not present

## 2020-10-28 DIAGNOSIS — D2371 Other benign neoplasm of skin of right lower limb, including hip: Secondary | ICD-10-CM | POA: Diagnosis not present

## 2020-11-21 ENCOUNTER — Other Ambulatory Visit: Payer: Self-pay

## 2020-11-21 MED FILL — Cyanocobalamin Inj 1000 MCG/ML: INTRAMUSCULAR | 84 days supply | Qty: 3 | Fill #1 | Status: AC

## 2020-12-05 ENCOUNTER — Other Ambulatory Visit: Payer: Self-pay

## 2021-04-01 ENCOUNTER — Encounter: Payer: Self-pay | Admitting: Family Medicine

## 2021-04-01 ENCOUNTER — Other Ambulatory Visit: Payer: Self-pay

## 2021-04-01 DIAGNOSIS — Z1231 Encounter for screening mammogram for malignant neoplasm of breast: Secondary | ICD-10-CM

## 2021-05-09 ENCOUNTER — Other Ambulatory Visit: Payer: Self-pay

## 2021-05-09 ENCOUNTER — Ambulatory Visit
Admission: RE | Admit: 2021-05-09 | Discharge: 2021-05-09 | Disposition: A | Discharge status: Home / Self Care | Payer: 59 | Source: Ambulatory Visit | Attending: Family Medicine | Admitting: Family Medicine

## 2021-05-09 DIAGNOSIS — Z1231 Encounter for screening mammogram for malignant neoplasm of breast: Secondary | ICD-10-CM | POA: Diagnosis not present

## 2021-05-19 DIAGNOSIS — H52223 Regular astigmatism, bilateral: Secondary | ICD-10-CM | POA: Diagnosis not present

## 2021-05-21 ENCOUNTER — Other Ambulatory Visit: Payer: Self-pay

## 2021-05-21 MED FILL — Cyanocobalamin Inj 1000 MCG/ML: INTRAMUSCULAR | 84 days supply | Qty: 3 | Fill #2 | Status: AC

## 2021-07-09 ENCOUNTER — Other Ambulatory Visit: Payer: Self-pay

## 2021-07-09 MED ORDER — COVID-19 AT HOME ANTIGEN TEST VI KIT
PACK | 0 refills | Status: DC
  Filled 2021-07-09: qty 2, 4d supply, fill #0

## 2021-07-09 MED ORDER — AMOXICILLIN 500 MG PO CAPS
ORAL_CAPSULE | ORAL | 1 refills | Status: DC
  Filled 2021-07-09: qty 30, 7d supply, fill #0

## 2021-07-24 ENCOUNTER — Encounter: Payer: Self-pay | Admitting: Family Medicine

## 2021-07-24 ENCOUNTER — Ambulatory Visit (INDEPENDENT_AMBULATORY_CARE_PROVIDER_SITE_OTHER): Payer: 59 | Admitting: Family Medicine

## 2021-07-24 VITALS — BP 103/65 | HR 66 | Resp 16 | Ht 61.0 in | Wt 191.0 lb

## 2021-07-24 DIAGNOSIS — E559 Vitamin D deficiency, unspecified: Secondary | ICD-10-CM

## 2021-07-24 DIAGNOSIS — E782 Mixed hyperlipidemia: Secondary | ICD-10-CM

## 2021-07-24 DIAGNOSIS — Z0000 Encounter for general adult medical examination without abnormal findings: Secondary | ICD-10-CM | POA: Diagnosis not present

## 2021-07-24 DIAGNOSIS — E538 Deficiency of other specified B group vitamins: Secondary | ICD-10-CM | POA: Diagnosis not present

## 2021-07-24 NOTE — Progress Notes (Signed)
Complete physical exam  I,[name],acting as a scribe for Wilhemena Durie, MD.,have documented all relevant documentation on the behalf of Wilhemena Durie, MD,as directed by  Wilhemena Durie, MD while in the presence of Wilhemena Durie, MD.   Patient: Kaylee Jones   DOB: 27-Oct-1960   61 y.o. Female  MRN: [id number] Visit Date: 07/24/2021  Today's healthcare provider: Wilhemena Durie, MD   Chief Complaint  Patient presents with   Annual Exam   Subjective    Kaylee Jones is a 61 y.o. female who presents today for a complete physical exam.  She reports consuming a general diet. The patient does not participate in regular exercise at present. She generally feels well. She reports sleeping poorly. She does not have additional problems to discuss today.  HPI  She and her husband now have legal custody of their one and 45-year-old grandchildren.  They are exhausted from this. Patient lost 60 pounds last year he is using optive via diet.  She has gained 20 pounds back this year with the stress of raising grandchildren.  Past Medical History:  Diagnosis Date   Anal fissure 08/2018   Anxiety    GERD (gastroesophageal reflux disease)    Past Surgical History:  Procedure Laterality Date   COLONOSCOPY WITH PROPOFOL N/A 02/09/2019   Procedure: COLONOSCOPY WITH PROPOFOL;  Surgeon: Toledo, Benay Pike, MD;  Location: [facility] END[facility]COPY;  Service: Gastroenterology;  Laterality: N/A;   LAPAROSCOPIC CHOLECYSTECTOMY     TONSILLECTOMY     TUBAL LIGATION     Social History   Socioeconomic History   Marital status: Married    Spouse name: Not on file   Number of children: Not on file   Years of education: Not on file   Highest education level: Not on file  Occupational History   Occupation: asst director of pain management  Tobacco Use   Smoking status: Never   Smokeless tobacco: Never  Vaping Use   Vaping Use: Never used  Substance and Sexual Activity   Alcohol use:  Yes    Alcohol/week: 0.0 standard drinks    Comment: rare   Drug use: No   Sexual activity: Not on file  Other Topics Concern   Not on file  Social History Narrative   Not on file   Social Determinants of Health   Financial Resource Strain: Not on file  Food Insecurity: Not on file  Transportation Needs: Not on file  Physical Activity: Not on file  Stress: Not on file  Social Connections: Not on file  Intimate Partner Violence: Not on file   Family Status  Relation Name Status   Mother  Alive   Father  Deceased   Neg Hx  (Not Specified)   Family History  Problem Relation Age of Onset   Hypertension Mother    Esophageal varices Father    Breast cancer Neg Hx    Allergies  Allergen Reactions   Codeine     Patient reports she hallucinates     Patient Care Team: Jerrol Banana., MD as PCP - General (Family Medicine)   Medications: Outpatient Medications Prior to Visit  Medication Sig   acetaminophen (TYLENOL) 325 MG tablet Take 650 mg by mouth every 4 (four) hours as needed.   ALPRAZolam (XANAX) 0.25 MG tablet Take 1 tablet (0.25 mg total) by mouth as needed for anxiety.   Cholecalciferol (D-3-5) 125 MCG (5000 UT) capsule    COVID-19  At Home Antigen Test [facility] COVID-19 HOME TEST) KIT Use as directed   COVID-19 At Home Antigen Test ([facility] COVID-19 HOME TEST) KIT Use as directed   cyanocobalamin (,VITAMIN B-12,) 1000 MCG/ML injection Injection 1 mL into the muscle once weekly for 4 weeks. Than once monthly.   cyanocobalamin (,VITAMIN B-12,) 1000 MCG/ML injection INJECTION 1 ML INTO THE MUSCLE ONCE WEEKLY FOR 4 WEEKS. THEN ONCE MONTHLY.   ibuprofen (ADVIL) 800 MG tablet Take 800 mg by mouth every 6 (six) hours as needed.   [DISCONTINUED] amoxicillin (AMOXIL) 500 MG capsule Take 1 capsule by mouth every 6 hours (Patient not taking: Reported on 07/24/2021)   No facility-administered medications prior to visit.    Review of Systems  Musculoskeletal:   Positive for arthralgias and myalgias.  All other systems reviewed and are negative.  Last thyroid functions Lab Results  Component Value Date   TSH 2.210 07/24/2021      Objective     BP 103/65 (BP Location: Left Arm, Patient Position: Bed low/side rails up, Cuff Size: Large)   Pulse 66   Resp 16   Ht 5' 1" (1.549 m)   Wt 191 lb (86.6 kg)   LMP 04/06/2016   SpO2 100%   BMI 36.09 kg/m  BP Readings from Last 3 Encounters:  07/24/21 103/65  07/18/20 122/76  01/18/20 127/75   Wt Readings from Last 3 Encounters:  07/24/21 191 lb (86.6 kg)  07/18/20 184 lb (83.5 kg)  01/18/20 222 lb (100.7 kg)       Physical Exam Vitals reviewed.  Constitutional:      General: She is not in acute distress.    Appearance: Normal appearance. She is well-developed. She is not diaphoretic.  HENT:     Head: Normocephalic and atraumatic.     Right Ear: Tympanic membrane, ear canal and external ear normal.     Left Ear: Tympanic membrane, ear canal and external ear normal.  Eyes:     General: No scleral icterus.    Conjunctiva/sclera: Conjunctivae normal.     Pupils: Pupils are equal, round, and reactive to light.  Neck:     Thyroid: No thyromegaly.  Cardiovascular:     Rate and Rhythm: Normal rate and regular rhythm.     Pulses: Normal pulses.     Heart sounds: Normal heart sounds. No murmur heard. Pulmonary:     Effort: Pulmonary effort is normal. No respiratory distress.     Breath sounds: Normal breath sounds. No wheezing or rales.  Abdominal:     General: There is no distension.     Palpations: Abdomen is soft.     Tenderness: There is no abdominal tenderness.  Musculoskeletal:        General: No deformity.     Cervical back: Neck supple.     Right lower leg: No edema.     Left lower leg: No edema.  Lymphadenopathy:     Cervical: No cervical adenopathy.  Skin:    General: Skin is warm and dry.     Capillary Refill: Capillary refill takes less than 2 seconds.      Findings: No rash.  Neurological:     General: No focal deficit present.     Mental Status: She is alert and oriented to person, place, and time.  Psychiatric:        Mood and Affect: Mood normal.        Behavior: Behavior normal.        Thought Content:  Thought content normal.        Judgment: Judgment normal.      Last depression screening scores    07/18/2020    2:10 PM 09/27/2019    4:47 PM 01/13/2019    1:32 PM  PHQ 2/9 Scores  PHQ - 2 Score 0 0 0  PHQ- 9 Score 1 4 0   Last fall risk screening    07/18/2020    2:10 PM  [place] in the past year? 0  Number falls in past yr: 0  Injury with Fall? 0  Follow up Falls evaluation completed   Last Audit-C alcohol use screening     View : No data to display.         A score of 3 or more in women, and 4 or more in men indicates increased risk for alcohol abuse, EXCEPT if all of the points are from question 1   No results found for any visits on 07/24/21.  Assessment & Plan    Routine Health Maintenance and Physical Exam  Exercise Activities and Dietary recommendations  Goals   None     Immunization History  Administered Date(s) Administered   Influenza-Unspecified 12/07/2018   PFIZER(Purple Top)SARS-COV-2 Vaccination 02/27/2019, 03/20/2019   Tdap 07/18/2020    Health Maintenance  Topic Date Due   HIV Screening  Never done   Zoster Vaccines- Shingrix (1 of 2) Never done   COVID-19 Vaccine (3 - Booster for Pfizer series) 05/15/2019   INFLUENZA VACCINE  09/30/2021   MAMMOGRAM  05/10/2023   PAP SMEAR-Modifier  07/19/2023   COLONOSCOPY (Pts 45-27yr Insurance coverage will need to be confirmed)  02/08/2029   TETANUS/TDAP  07/19/2030   Hepatitis C Screening  Completed   HPV VACCINES  Aged Out    Discussed health benefits of physical activity, and encouraged her to engage in regular exercise appropriate for her age and condition.  1. Encounter for annual physical exam  - Lipid panel - TSH - CBC  w/Diff/Platelet - Comprehensive Metabolic Panel (CMET) - Hemoglobin A1c  2. Mixed hyperlipidemia  - Lipid panel - TSH - CBC w/Diff/Platelet - Comprehensive Metabolic Panel (CMET) - Hemoglobin A1c  3. Morbid obesity ([place] Lifestyle changes are discussed.  Consider an Ozempic type drug if she is prediabetic. - Lipid panel - TSH - CBC w/Diff/Platelet - Comprehensive Metabolic Panel (CMET) - Hemoglobin A1c  4. Vitamin D deficiency  - VITAMIN D 25 Hydroxy (Vit-D Deficiency, Fractures)  5. B12 deficiency  - Vitamin B12   Return in about 1 year (around 07/25/2022).     I, RWilhemena Durie MD, have reviewed all documentation for this visit. The documentation on 07/25/21 for the exam, diagnosis, procedures, and orders are all accurate and complete.     GCranford Mon MD  [phone](phone) [phone](fax)  [place]

## 2021-07-25 LAB — TSH: TSH: 2.21 u[IU]/mL (ref 0.450–4.500)

## 2021-07-25 LAB — COMPREHENSIVE METABOLIC PANEL
ALT: 19 IU/L (ref 0–32)
AST: 21 IU/L (ref 0–40)
Albumin/Globulin Ratio: 1.7 (ref 1.2–2.2)
Albumin: 4.5 g/dL (ref 3.8–4.9)
Alkaline Phosphatase: 107 IU/L (ref 44–121)
BUN/Creatinine Ratio: 15 (ref 12–28)
BUN: 13 mg/dL (ref 8–27)
Bilirubin Total: 0.5 mg/dL (ref 0.0–1.2)
CO2: 23 mmol/L (ref 20–29)
Calcium: 9.5 mg/dL (ref 8.7–10.3)
Chloride: 103 mmol/L (ref 96–106)
Creatinine, Ser: 0.85 mg/dL (ref 0.57–1.00)
Globulin, Total: 2.7 g/dL (ref 1.5–4.5)
Glucose: 99 mg/dL (ref 70–99)
Potassium: 4.7 mmol/L (ref 3.5–5.2)
Sodium: 140 mmol/L (ref 134–144)
Total Protein: 7.2 g/dL (ref 6.0–8.5)
eGFR: 78 mL/min/{1.73_m2} (ref >59)

## 2021-07-25 LAB — LIPID PANEL
Chol/HDL Ratio: 4.5 ratio — ABNORMAL HIGH (ref 0.0–4.4)
Cholesterol, Total: 302 mg/dL — ABNORMAL HIGH (ref 100–199)
HDL: 67 mg/dL (ref >39)
LDL Chol Calc (NIH): 217 mg/dL — ABNORMAL HIGH (ref 0–99)
Triglycerides: 104 mg/dL (ref 0–149)
VLDL Cholesterol Cal: 18 mg/dL (ref 5–40)

## 2021-07-25 LAB — CBC WITH DIFFERENTIAL/PLATELET
Basophils Absolute: 0 10*3/uL (ref 0.0–0.2)
Basos: 0 %
EOS (ABSOLUTE): 0.2 10*3/uL (ref 0.0–0.4)
Eos: 2 %
Hematocrit: 42.2 % (ref 34.0–46.6)
Hemoglobin: 14.4 g/dL (ref 11.1–15.9)
Immature Grans (Abs): 0.1 10*3/uL (ref 0.0–0.1)
Immature Granulocytes: 1 %
Lymphocytes Absolute: 2.4 10*3/uL (ref 0.7–3.1)
Lymphs: 29 %
MCH: 29.4 pg (ref 26.6–33.0)
MCHC: 34.1 g/dL (ref 31.5–35.7)
MCV: 86 fL (ref 79–97)
Monocytes Absolute: 0.5 10*3/uL (ref 0.1–0.9)
Monocytes: 6 %
Neutrophils Absolute: 5.2 10*3/uL (ref 1.4–7.0)
Neutrophils: 62 %
Platelets: 298 10*3/uL (ref 150–450)
RBC: 4.9 x10E6/uL (ref 3.77–5.28)
RDW: 13.3 % (ref 11.7–15.4)
WBC: 8.5 10*3/uL (ref 3.4–10.8)

## 2021-07-25 LAB — HEMOGLOBIN A1C
Est. average glucose Bld gHb Est-mCnc: 111 mg/dL
Hgb A1c MFr Bld: 5.5 % (ref 4.8–5.6)

## 2021-07-25 LAB — VITAMIN B12: Vitamin B-12: 444 pg/mL (ref 232–1245)

## 2021-07-25 LAB — VITAMIN D 25 HYDROXY (VIT D DEFICIENCY, FRACTURES): Vit D, 25-Hydroxy: 37 ng/mL (ref 30.0–100.0)

## 2021-08-05 ENCOUNTER — Encounter: Payer: Self-pay | Admitting: Family Medicine

## 2021-08-05 ENCOUNTER — Other Ambulatory Visit: Payer: Self-pay

## 2021-08-05 MED ORDER — AMOXICILLIN 500 MG PO CAPS
ORAL_CAPSULE | ORAL | 0 refills | Status: DC
  Filled 2021-08-05: qty 30, 7d supply, fill #0

## 2021-08-05 MED FILL — Cyanocobalamin Inj 1000 MCG/ML: INTRAMUSCULAR | 84 days supply | Qty: 3 | Fill #3 | Status: AC

## 2021-10-21 DIAGNOSIS — D2262 Melanocytic nevi of left upper limb, including shoulder: Secondary | ICD-10-CM | POA: Diagnosis not present

## 2021-10-21 DIAGNOSIS — D2271 Melanocytic nevi of right lower limb, including hip: Secondary | ICD-10-CM | POA: Diagnosis not present

## 2021-10-21 DIAGNOSIS — L814 Other melanin hyperpigmentation: Secondary | ICD-10-CM | POA: Diagnosis not present

## 2021-10-21 DIAGNOSIS — L821 Other seborrheic keratosis: Secondary | ICD-10-CM | POA: Diagnosis not present

## 2021-10-21 DIAGNOSIS — D2261 Melanocytic nevi of right upper limb, including shoulder: Secondary | ICD-10-CM | POA: Diagnosis not present

## 2021-10-21 DIAGNOSIS — D2272 Melanocytic nevi of left lower limb, including hip: Secondary | ICD-10-CM | POA: Diagnosis not present

## 2021-10-21 DIAGNOSIS — D225 Melanocytic nevi of trunk: Secondary | ICD-10-CM | POA: Diagnosis not present

## 2021-11-21 NOTE — Progress Notes (Unsigned)
Established patient visit  I,[name],acting as a scribe for Wilhemena Durie, MD.,have documented all relevant documentation on the behalf of Wilhemena Durie, MD,as directed by  Wilhemena Durie, MD while in the presence of Wilhemena Durie, MD.   Patient: Kaylee Jones   DOB: [birth year]   61 y.o. Female  MRN: [id number] Visit Date: 11/24/2021  Today's healthcare provider: Wilhemena Durie, MD   Chief Complaint  Patient presents with   Follow-up   Hyperlipidemia   Subjective    HPI  She comes in today for routine follow-up.  She is a Marine scientist at [employer].  She has been working hard.  Labs that were fasting show an LDL cholesterol of 217 from the spring.  We discussed options which include starting rosuvastatin.  She is right now to work on diet and exercise lifestyle and start Metamucil and see how much lower she can take this .  She has no specific complaints today and overall is doing well.  She needs a refill on her Xanax which she takes about 1-2 times per month.  Lipid/Cholesterol, Follow-up  Last lipid panel Other pertinent labs  Lab Results  Component Value Date   CHOL 302 (H) 07/24/2021   HDL 67 07/24/2021   LDLCALC 217 (H) 07/24/2021   TRIG 104 07/24/2021   CHOLHDL 4.5 (H) 07/24/2021   Lab Results  Component Value Date   ALT 19 07/24/2021   AST 21 07/24/2021   PLT 298 07/24/2021   TSH 2.210 07/24/2021     She was last seen for this 4 months ago.  Management since that visit includes; labs checked showing-cholesterol was very high.  The 10-year ASCVD risk score (Arnett DK, et al., 2019) is: 3.9%  ---------------------------------------------------------------------------------------------------   Medications: Outpatient Medications Prior to Visit  Medication Sig   acetaminophen (TYLENOL) 325 MG tablet Take 650 mg by mouth every 4 (four) hours as needed.   Cholecalciferol (D-3-5) 125 MCG (5000 UT) capsule    ibuprofen (ADVIL) 800 MG tablet  Take 800 mg by mouth every 6 (six) hours as needed.   [DISCONTINUED] ALPRAZolam (XANAX) 0.25 MG tablet Take 1 tablet (0.25 mg total) by mouth as needed for anxiety.   [DISCONTINUED] cyanocobalamin (,VITAMIN B-12,) 1000 MCG/ML injection INJECTION 1 ML INTO THE MUSCLE ONCE WEEKLY FOR 4 WEEKS. THEN ONCE MONTHLY.   [DISCONTINUED] amoxicillin (AMOXIL) 500 MG capsule Take 1 capsule by mouth every 6 hours until gone (Patient not taking: Reported on 11/24/2021)   [DISCONTINUED] COVID-19 At Home Antigen Test ([facility] COVID-19 HOME TEST) KIT Use as directed (Patient not taking: Reported on 11/24/2021)   [DISCONTINUED] COVID-19 At Home Antigen Test ([facility]START COVID-19 HOME TEST) KIT Use as directed (Patient not taking: Reported on 11/24/2021)   [DISCONTINUED] cyanocobalamin (,VITAMIN B-12,) 1000 MCG/ML injection Injection 1 mL into the muscle once weekly for 4 weeks. Than once monthly. (Patient not taking: Reported on 11/24/2021)   No facility-administered medications prior to visit.    Review of Systems  Constitutional:  Negative for appetite change, chills, fatigue and fever.  Respiratory:  Negative for chest tightness and shortness of breath.   Cardiovascular:  Negative for chest pain and palpitations.  Gastrointestinal:  Negative for abdominal pain, nausea and vomiting.  Neurological:  Negative for dizziness and weakness.    Last lipids Lab Results  Component Value Date   CHOL 302 (H) 07/24/2021   HDL 67 07/24/2021   LDLCALC 217 (H) 07/24/2021   TRIG 104 07/24/2021  CHOLHDL 4.5 (H) 07/24/2021       Objective    BP 126/83 (BP Location: Right Arm, Patient Position: Sitting, Cuff Size: Large)   Pulse 73   Resp 16   Wt 211 lb (95.7 kg)   LMP 04/06/2016   SpO2 97%   BMI 39.87 kg/m  BP Readings from Last 3 Encounters:  11/24/21 126/83  07/24/21 103/65  07/18/20 122/76   Wt Readings from Last 3 Encounters:  11/24/21 211 lb (95.7 kg)  07/24/21 191 lb (86.6 kg)  07/18/20 184 lb (83.5  kg)      Physical Exam Vitals reviewed.  Constitutional:      General: She is not in acute distress.    Appearance: She is well-developed.  HENT:     Head: Normocephalic and atraumatic.     Right Ear: Hearing normal.     Left Ear: Hearing normal.     Nose: Nose normal.  Eyes:     General: Lids are normal. No scleral icterus.       Right eye: No discharge.        Left eye: No discharge.     Conjunctiva/sclera: Conjunctivae normal.  Cardiovascular:     Rate and Rhythm: Normal rate and regular rhythm.     Heart sounds: Normal heart sounds.  Pulmonary:     Effort: Pulmonary effort is normal. No respiratory distress.  Skin:    Findings: No lesion or rash.  Neurological:     General: No focal deficit present.     Mental Status: She is alert and oriented to person, place, and time.  Psychiatric:        Mood and Affect: Mood normal.        Speech: Speech normal.        Behavior: Behavior normal.        Thought Content: Thought content normal.        Judgment: Judgment normal.       No results found for any visits on 11/24/21.  Assessment & Plan     1. Mixed hyperlipidemia Work hard on diet and exercise and start Metamucil daily.  Follow-up lipids in about 6 months. If LDL not below 190 with then start rosuvastatin at 10 or 20 mg daily - Lipid panel  2. B12 deficiency Continue B12 injections.  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 THEN ONCE MONTHLY.  Dispense: 1 mL; Refill: 12  3. Hemorrhoids, unspecified hemorrhoid type Sitz bath is a main treatment - hydrocortisone (ANUSOL-HC) 25 MG suppository; UNWRAP AND INSERT 1 SUPPOSITORY RECTALLY TWO TIMES DAILY FOR 14 DAYS  Dispense: 12 suppository; Refill: 1  4. Anxiety Takes 1-2 times per month. - ALPRAZolam (XANAX) 0.25 MG tablet; Take 1 tablet (0.25 mg total) by mouth as needed for anxiety.  Dispense: 30  tablet; Refill: 2   No follow-ups on file.      I, Wilhemena Durie, MD, have reviewed all documentation for this visit. The documentation on 11/26/21 for the exam, diagnosis, procedures, and orders are all accurate and complete.    Armonee Bojanowski Cranford Mon, MD  [facility] [phone] (phone) [phone] (fax)  [place]

## 2021-11-24 ENCOUNTER — Other Ambulatory Visit: Payer: Self-pay

## 2021-11-24 ENCOUNTER — Ambulatory Visit: Payer: 59 | Admitting: Family Medicine

## 2021-11-24 ENCOUNTER — Encounter: Payer: Self-pay | Admitting: Family Medicine

## 2021-11-24 VITALS — BP 126/83 | HR 73 | Resp 16 | Wt 211.0 lb

## 2021-11-24 DIAGNOSIS — E782 Mixed hyperlipidemia: Secondary | ICD-10-CM

## 2021-11-24 DIAGNOSIS — K649 Unspecified hemorrhoids: Secondary | ICD-10-CM

## 2021-11-24 DIAGNOSIS — E538 Deficiency of other specified B group vitamins: Secondary | ICD-10-CM | POA: Diagnosis not present

## 2021-11-24 DIAGNOSIS — F419 Anxiety disorder, unspecified: Secondary | ICD-10-CM | POA: Diagnosis not present

## 2021-11-24 MED ORDER — ALPRAZOLAM 0.25 MG PO TABS
0.2500 mg | ORAL_TABLET | ORAL | 2 refills | Status: AC | PRN
  Filled 2021-11-24: qty 30, 30d supply, fill #0

## 2021-11-24 MED ORDER — CYANOCOBALAMIN 1000 MCG/ML IJ SOLN
INTRAMUSCULAR | 12 refills | Status: DC
  Filled 2021-11-24: qty 1, 28d supply, fill #0

## 2021-11-24 MED ORDER — HYDROCORTISONE ACETATE 25 MG RE SUPP
RECTAL | 1 refills | Status: AC
  Filled 2021-11-24: qty 12, 6d supply, fill #0

## 2021-11-24 NOTE — Patient Instructions (Signed)
Work on diet and exercise for light weight loss and lower cholesterol.  Try a dose of Metamucil first thing every morning.

## 2022-02-17 NOTE — Progress Notes (Signed)
I,Joseline E Rosas,acting as a scribe for [employer], MD.,have documented all relevant documentation on the behalf of Eulis Foster, MD,as directed by  Eulis Foster, MD while in the presence of Eulis Foster, MD.   Established patient visit   Patient: Kaylee Jones   DOB: April 19, 1960   61 y.o. Female  MRN: [id number] Visit Date: 02/19/2022  Today's healthcare provider: Eulis Foster, MD   Chief Complaint  Patient presents with   Obesity   Subjective    HPI  Obesity: Patient complains of obesity. Patient cites health, increased physical ability, self-image, cholesterol too high as reasons for wanting to lose weight. She has questions about Zepbound wold be something for her.  History of Weight Loss Efforts Patient has tried [employer] weight loss program and was able to lose 60 lbs. She has tried fasting and Phentermine (2009 lost weight) and is not able to maintained the weight off. 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 patient and granddaughter Who prepares food? patient Who eats with patient? patient, spouse, and granddaughter and grandson's Binge behavior?: no Purge behavior? no Anorexic behavior? no Eating precipitated by stress? yes -  Guilt feelings associated with eating? No  Repots cravings for sugar and often finds herself thinking of what to eat  [employer] program that was focused on prepared meal plans including bars and shakes, she reports plan did not include physical activity    Other Potential Contributing Factors Use of alcohol: average 0  drinks/week Use of medications that may cause weight gain none History of past abuse? none Psych History: anxiety and obesity Comorbidities: dyslipidemias   Wt Readings from Last 3 Encounters:  02/19/22 219 lb 14.4 oz (99.7 kg)  11/24/21 211 lb (95.7 kg)  07/24/21 191 lb (86.6 kg)   BP Readings from Last 3 Encounters:  02/19/22 128/84  11/24/21 126/83  07/24/21 103/65     Medications: Outpatient Medications Prior to Visit  Medication Sig   acetaminophen (TYLENOL) 325 MG tablet Take 650 mg by mouth every 4 (four) hours as needed.   ALPRAZolam (XANAX) 0.25 MG tablet Take 1 tablet (0.25 mg total) by mouth as needed for anxiety.   Cholecalciferol (D-3-5) 125 MCG (5000 UT) capsule    cyanocobalamin (VITAMIN B12) 1000 MCG/ML injection INJECTION 1 ML INTO THE MUSCLE ONCE WEEKLY FOR 4 WEEKS. THEN ONCE MONTHLY.   hydrocortisone (ANUSOL-HC) 25 MG suppository UNWRAP AND INSERT 1 SUPPOSITORY RECTALLY TWO TIMES DAILY FOR 14 DAYS   ibuprofen (ADVIL) 800 MG tablet Take 800 mg by mouth every 6 (six) hours as needed.   No facility-administered medications prior to visit.    Review of Systems      Objective    BP 128/84 (BP Location: Left Arm, Patient Position: Sitting, Cuff Size: Large)   Pulse 80   Temp 98.7 F (37.1 C) (Oral)   Resp 16   Ht '5\' 1"'$  (1.549 m)   Wt 219 lb 14.4 oz (99.7 kg)   LMP 04/06/2016   BMI 41.55 kg/m     Physical Exam Vitals reviewed.  Constitutional:      General:  She is not in acute distress.    Appearance: Normal appearance. She is not ill-appearing, toxic-appearing or diaphoretic.  Eyes:     Conjunctiva/sclera: Conjunctivae normal.  Cardiovascular:     Rate and Rhythm: Normal rate and regular rhythm.     Pulses: Normal pulses.     Heart sounds: Normal heart sounds. No murmur heard.    No friction rub. No gallop.  Pulmonary:     Effort: Pulmonary effort is normal. No respiratory distress.     Breath sounds: Normal breath sounds. No stridor. No  wheezing, rhonchi or rales.  Abdominal:     General: Bowel sounds are normal. There is no distension.     Palpations: Abdomen is soft.     Tenderness: There is no abdominal tenderness.  Musculoskeletal:     Right lower leg: No edema.     Left lower leg: No edema.  Skin:    Findings: No erythema or rash.  Neurological:     Mental Status: She is alert and oriented to person, place, and time.      No results found for any visits on 02/19/22.   Assessment & Plan     Problem List Items Addressed This Visit       Other   Obesity, Class III, BMI 40-49.9 (morbid obesity) ([place]) - Primary    Morbid Obesity with BMI 41.55  Will trial 1 month Zepbound 2.'5mg'$  weekly  Follow up in 1 month for weight management       Relevant Medications   tirzepatide (ZEPBOUND) 2.5 MG/0.5ML Pen     No follow-ups on file.     I, Eulis Foster, MD, have reviewed all documentation for this visit.  Portions of this information were initially documented by the CMA and reviewed by me for thoroughness and accuracy.      Eulis Foster, MD  [facility] [phone] (phone) [phone] (fax)  [place]

## 2022-02-19 ENCOUNTER — Ambulatory Visit: Payer: 59 | Admitting: Family Medicine

## 2022-02-19 ENCOUNTER — Encounter: Payer: Self-pay | Admitting: Family Medicine

## 2022-02-19 VITALS — BP 128/84 | HR 80 | Temp 98.7°F | Resp 16 | Ht 61.0 in | Wt 219.9 lb

## 2022-02-23 ENCOUNTER — Other Ambulatory Visit: Payer: Self-pay

## 2022-02-23 MED ORDER — TIRZEPATIDE-WEIGHT MANAGEMENT 2.5 MG/0.5ML ~~LOC~~ SOAJ
2.5000 mg | SUBCUTANEOUS | 0 refills | Status: DC
  Filled 2022-02-23: qty 2, 28d supply, fill #0

## 2022-02-23 NOTE — Assessment & Plan Note (Signed)
Morbid Obesity with BMI 41.55  Will trial 1 month Zepbound 2.'5mg'$  weekly  Follow up in 1 month for weight management

## 2022-02-24 ENCOUNTER — Other Ambulatory Visit: Payer: Self-pay

## 2022-02-24 ENCOUNTER — Encounter: Payer: Self-pay | Admitting: Family Medicine

## 2022-02-24 NOTE — Telephone Encounter (Signed)
PA for Zepbound was started today.

## 2022-02-26 ENCOUNTER — Other Ambulatory Visit: Payer: Self-pay

## 2022-02-27 ENCOUNTER — Other Ambulatory Visit: Payer: Self-pay | Admitting: Family Medicine

## 2022-02-27 ENCOUNTER — Other Ambulatory Visit: Payer: Self-pay

## 2022-02-27 MED ORDER — SEMAGLUTIDE-WEIGHT MANAGEMENT 0.25 MG/0.5ML ~~LOC~~ SOAJ
0.2500 mg | SUBCUTANEOUS | 0 refills | Status: DC
  Filled 2022-02-27 – 2022-03-13 (×5): qty 2, 28d supply, fill #0

## 2022-03-09 ENCOUNTER — Other Ambulatory Visit: Payer: Self-pay

## 2022-03-12 ENCOUNTER — Other Ambulatory Visit: Payer: Self-pay

## 2022-03-13 ENCOUNTER — Other Ambulatory Visit: Payer: Self-pay

## 2022-03-16 ENCOUNTER — Telehealth: Payer: Self-pay | Admitting: Family Medicine

## 2022-03-16 NOTE — Telephone Encounter (Signed)
Patient called in about status of PA for [facility]. Please call back

## 2022-03-16 NOTE — Telephone Encounter (Signed)
Received form requesting additional information. Form was completed and will be faxed back. Approval from prior authorization is still awaiting response.   Kaylee Foster, MD  [facility]

## 2022-03-17 ENCOUNTER — Other Ambulatory Visit: Payer: Self-pay

## 2022-03-17 NOTE — Telephone Encounter (Signed)
Approved today

## 2022-03-18 ENCOUNTER — Other Ambulatory Visit (HOSPITAL_BASED_OUTPATIENT_CLINIC_OR_DEPARTMENT_OTHER): Payer: Self-pay

## 2022-03-19 ENCOUNTER — Other Ambulatory Visit: Payer: Self-pay

## 2022-03-20 NOTE — Progress Notes (Unsigned)
I,Joseline E Rosas,acting as a scribe for [employer], MD.,have documented all relevant documentation on the behalf of Eulis Foster, MD,as directed by  Eulis Foster, MD while in the presence of Eulis Foster, MD.   Established patient visit   Patient: Kaylee Jones   DOB: November 20, 1960   62 y.o. Female  MRN: [id number] Visit Date: 03/23/2022  Today's healthcare provider: Eulis Foster, MD   Chief Complaint  Patient presents with   Follow-up   Subjective    HPI  Follow up for weight management  The patient was last seen for this 1 months ago. Changes made at last visit include start [facility].Reports that she just started [facility] and just took the first shot. Reports that she has not started increasing physical activity but has been changing her diet.  She denies any weight loss at this point but is planning to start exercise regimen now that she has her medication   Wt Readings from Last 3 Encounters:  03/23/22 219 lb 8 oz (99.6 kg)  02/19/22 219 lb 14.4 oz (99.7 kg)  11/24/21 211 lb (95.7 kg)     Medications: Outpatient Medications Prior to Visit  Medication Sig   acetaminophen (TYLENOL) 325 MG tablet Take 650 mg by mouth every 4 (four) hours as needed.   ALPRAZolam (XANAX) 0.25 MG tablet Take 1 tablet (0.25 mg total) by mouth as needed for anxiety.   Cholecalciferol (D-3-5) 125 MCG (5000 UT) capsule    cyanocobalamin (VITAMIN B12) 1000 MCG/ML injection INJECTION 1 ML INTO THE MUSCLE ONCE WEEKLY FOR 4 WEEKS. THEN ONCE MONTHLY.   hydrocortisone (ANUSOL-HC) 25 MG suppository UNWRAP AND INSERT 1 SUPPOSITORY RECTALLY TWO TIMES DAILY FOR 14 DAYS   ibuprofen (ADVIL) 800 MG tablet Take 800 mg by mouth every 6 (six) hours as needed.   Semaglutide-Weight Management 0.25 MG/0.5ML SOAJ Inject 0.25 mg into the skin once a week for 28 days.   No facility-administered medications prior to visit.    Review of Systems      Objective    BP 131/81 (BP Location: Left Arm, Patient Position: Sitting, Cuff Size: Large)   Pulse 76   Temp 99.1 F (37.3 C) (Oral)   Resp 16   Ht '5\' 1"'$  (1.549 m)   Wt 219 lb 8 oz (99.6 kg)   LMP 04/06/2016   BMI 41.47 kg/m    Physical Exam Vitals reviewed.  Constitutional:      General: She is not in acute distress.    Appearance: Normal appearance. She is not ill-appearing, toxic-appearing or diaphoretic.  Eyes:     Conjunctiva/sclera: Conjunctivae normal.  Cardiovascular:     Rate and Rhythm: Normal rate and regular rhythm.     Pulses: Normal pulses.     Heart sounds: Normal heart sounds. No murmur heard.    No friction rub. No gallop.  Pulmonary:     Effort: Pulmonary effort is normal. No respiratory distress.     Breath sounds: Normal breath sounds. No stridor. No wheezing, rhonchi or rales.  Abdominal:     General: Bowel sounds are normal. There is no distension.     Palpations: Abdomen is soft.     Tenderness: There is no abdominal tenderness.  Musculoskeletal:     Right lower leg: No edema.     Left lower leg: No edema.  Skin:    Findings: No erythema or rash.  Neurological:     Mental Status: She is alert and oriented to person, place, and  time.       No results found for any visits on 03/23/22.  Assessment & Plan     Problem List Items Addressed This Visit       Other   Obesity, Class III, BMI 40-49.9 (morbid obesity) ([place]) - Primary    BMI of 41 Patient has maintain weight of 219 pounds Patient now has weight loss medication, ACZYSA She will continue with weekly injections of 0.25 mg and increase to 0.5 mg after 4 weeks Recommended 1 month follow-up for weight management Continue to encourage dietary management as well as increase physical activity to help with weight loss       Relevant Medications   Semaglutide-Weight Management 0.5 MG/0.5ML SOAJ     Return in about 1 month (around 04/23/2022) for weight management .      The  entirety of the information documented in the History of Present Illness, Review of Systems and Physical Exam were personally obtained by me. Portions of this information were initially documented by Lyndel Pleasure, CMA and reviewed by me for thoroughness and accuracy.Eulis Foster, MD   Eulis Foster, MD  [facility] [phone] (phone) [phone] (fax)  [place]

## 2022-03-23 ENCOUNTER — Ambulatory Visit (INDEPENDENT_AMBULATORY_CARE_PROVIDER_SITE_OTHER): Payer: 59 | Admitting: Family Medicine

## 2022-03-23 ENCOUNTER — Other Ambulatory Visit: Payer: Self-pay

## 2022-03-23 ENCOUNTER — Encounter: Payer: Self-pay | Admitting: Family Medicine

## 2022-03-23 VITALS — BP 131/81 | HR 76 | Temp 99.1°F | Resp 16 | Ht 61.0 in | Wt 219.5 lb

## 2022-03-23 MED ORDER — SEMAGLUTIDE-WEIGHT MANAGEMENT 0.5 MG/0.5ML ~~LOC~~ SOAJ
0.5000 mg | SUBCUTANEOUS | 1 refills | Status: DC
  Filled 2022-03-23 – 2022-04-06 (×2): qty 2, 28d supply, fill #0
  Filled 2022-04-29: qty 2, 28d supply, fill #1

## 2022-03-25 NOTE — Assessment & Plan Note (Signed)
BMI of 41 Patient has maintain weight of 219 pounds Patient now has weight loss medication, Mancel Parsons She will continue with weekly injections of 0.25 mg and increase to 0.5 mg after 4 weeks Recommended 1 month follow-up for weight management Continue to encourage dietary management as well as increase physical activity to help with weight loss

## 2022-04-06 ENCOUNTER — Other Ambulatory Visit: Payer: Self-pay

## 2022-04-23 NOTE — Progress Notes (Signed)
I,Joseline E Rosas,acting as a scribe for [employer], MD.,have documented all relevant documentation on the behalf of Ronnald Ramp, MD,as directed by  Ronnald Ramp, MD while in the presence of Ronnald Ramp, MD.   Established patient visit   Patient: Kaylee Jones   DOB: [birth year]   62 y.o. Female  MRN: [id number] Visit Date: 04/24/2022  Today's healthcare provider: Ronnald Ramp, MD   Chief Complaint  Patient presents with   Follow-up   Subjective    HPI   Obesity: Patient complains of obesity. She is currently on Wegovy 0.25mg  weekly to help with weight loss  Patent presents for follow up of medication assisted weight loss  She has lost 5 pounds since starting Wegovy 5 weeks ago She is tolerating the medication well without side effects  Her diet consists of salads, she has cut down fried foods. She is drinking water. Her exercise regimen consists of walking more. She is doing at least 5-10 thousand steps per day compared to previously around 2000steps per day  Wt Readings from Last 3 Encounters:  04/24/22 214 lb 12.8 oz (97.4 kg)  03/23/22 219 lb 8 oz (99.6 kg)  02/19/22 219 lb 14.4 oz (99.7 kg)     Medications: Outpatient Medications Prior to Visit  Medication Sig   acetaminophen (TYLENOL) 325 MG tablet Take 650 mg by mouth every 4 (four) hours as needed.   ALPRAZolam (XANAX) 0.25 MG tablet Take 1 tablet (0.25 mg total) by mouth as needed for anxiety.   Cholecalciferol (D-3-5) 125 MCG (5000 UT) capsule    cyanocobalamin (VITAMIN B12) 1000 MCG/ML injection INJECTION 1 ML INTO THE MUSCLE ONCE WEEKLY FOR 4 WEEKS. THEN ONCE MONTHLY.   hydrocortisone (ANUSOL-HC) 25 MG suppository UNWRAP AND INSERT 1 SUPPOSITORY RECTALLY TWO TIMES DAILY FOR 14 DAYS   ibuprofen (ADVIL) 800 MG tablet Take 800 mg by mouth every 6 (six) hours as needed.   [DISCONTINUED] Semaglutide-Weight Management 0.5 MG/0.5ML SOAJ Inject 0.5 mg  into the skin once a week.   [DISCONTINUED] Semaglutide-Weight Management 0.25 MG/0.5ML SOAJ Inject 0.25 mg into the skin once a week for 28 days.   No facility-administered medications prior to visit.    Review of Systems     Objective    BP 119/81 (BP Location: Left Arm, Patient Position: Sitting, Cuff Size: Large)   Pulse 75   Temp 98.5 F (36.9 C) (Oral)   Resp 16   Ht 5\' 1"  (1.549 m)   Wt 214 lb 12.8 oz (97.4 kg)   LMP 04/06/2016   BMI 40.59 kg/m    Physical Exam Vitals reviewed.  Constitutional:      General: She is not in acute distress.    Appearance: Normal appearance. She is not ill-appearing, toxic-appearing or diaphoretic.  Eyes:     Conjunctiva/sclera: Conjunctivae normal.  Cardiovascular:     Rate and Rhythm: Normal rate and regular rhythm.     Pulses: Normal pulses.     Heart sounds: Normal heart sounds. No murmur heard.    No friction rub. No gallop.  Pulmonary:     Effort: Pulmonary effort is normal. No respiratory distress.     Breath sounds: Normal breath sounds. No stridor. No wheezing, rhonchi or rales.  Abdominal:     General: Bowel sounds are normal. There is no distension.     Palpations: Abdomen is soft.     Tenderness: There is no abdominal tenderness.  Musculoskeletal:     Right lower leg: No  edema.     Left lower leg: No edema.  Skin:    Findings: No erythema or rash.  Neurological:     Mental Status: She is alert and oriented to person, place, and time.       No results found for any visits on 04/24/22.  Assessment & Plan     Problem List Items Addressed This Visit       Other   Obesity, Class III, BMI 40-49.9 (morbid obesity) (HCC)    Chronic problem  She has been on 5 weeks of semaglutide, now at 0.5mg  weekly  She is tolerating medication well  Will increase dose to 1mg  weekly after completing supply of 0.5mg  weekly  She will follow up in 3 months       Relevant Medications   Semaglutide-Weight Management 1 MG/0.5ML  SOAJ (Start on 06/21/2022)   Other Visit Diagnoses     Morbid obesity (HCC)    -  Primary   Relevant Medications   Semaglutide-Weight Management 1 MG/0.5ML SOAJ (Start on 06/21/2022)        Return in about 3 months (around 07/23/2022) for physical and weight management .        The entirety of the information documented in the History of Present Illness, Review of Systems and Physical Exam were personally obtained by me. Portions of this information were initially documented by Hetty Ely, CMA . I, Ronnald Ramp, MD have reviewed the documentation above for thoroughness and accuracy.   Ronnald Ramp, MD  [facility] [phone] (phone) [phone] (fax)  [facility]

## 2022-04-24 ENCOUNTER — Ambulatory Visit (INDEPENDENT_AMBULATORY_CARE_PROVIDER_SITE_OTHER): Payer: 59 | Admitting: Family Medicine

## 2022-04-24 ENCOUNTER — Encounter: Payer: Self-pay | Admitting: Family Medicine

## 2022-04-24 DIAGNOSIS — E66813 Obesity, class 3: Secondary | ICD-10-CM

## 2022-04-24 NOTE — Assessment & Plan Note (Signed)
Chronic problem  She has been on 5 weeks of semaglutide, now at 0.'5mg'$  weekly  She is tolerating medication well  Will increase dose to '1mg'$  weekly after completing supply of 0.'5mg'$  weekly  She will follow up in 3 months

## 2022-04-26 MED ORDER — SEMAGLUTIDE-WEIGHT MANAGEMENT 1 MG/0.5ML ~~LOC~~ SOAJ
1.0000 mg | SUBCUTANEOUS | 1 refills | Status: DC
  Filled 2022-04-26: qty 2, 28d supply, fill #0
  Filled 2022-09-22: qty 2, 28d supply, fill #1

## 2022-04-27 ENCOUNTER — Other Ambulatory Visit: Payer: Self-pay

## 2022-04-29 ENCOUNTER — Other Ambulatory Visit: Payer: Self-pay

## 2022-05-01 ENCOUNTER — Encounter: Payer: Self-pay | Admitting: Family Medicine

## 2022-05-04 ENCOUNTER — Other Ambulatory Visit: Payer: Self-pay | Admitting: Family Medicine

## 2022-05-04 ENCOUNTER — Other Ambulatory Visit: Payer: Self-pay

## 2022-05-04 MED ORDER — SEMAGLUTIDE-WEIGHT MANAGEMENT 1.7 MG/0.75ML ~~LOC~~ SOAJ
1.7000 mg | SUBCUTANEOUS | 2 refills | Status: DC
  Filled 2022-05-04 – 2022-05-27 (×3): qty 3, 28d supply, fill #0
  Filled 2022-06-23: qty 3, 28d supply, fill #1

## 2022-05-04 MED ORDER — SEMAGLUTIDE-WEIGHT MANAGEMENT 1.7 MG/0.75ML ~~LOC~~ SOAJ
1.7000 mg | SUBCUTANEOUS | 0 refills | Status: DC
  Filled 2022-05-04: qty 6, 28d supply, fill #0
  Filled 2022-05-04: qty 3, 28d supply, fill #0

## 2022-05-04 MED ORDER — SEMAGLUTIDE-WEIGHT MANAGEMENT 2.4 MG/0.75ML ~~LOC~~ SOAJ
2.4000 mg | SUBCUTANEOUS | 0 refills | Status: DC
  Filled 2022-05-04 – 2022-09-29 (×2): qty 6, 28d supply, fill #0
  Filled 2022-09-29: qty 3, 28d supply, fill #0
  Filled 2022-11-04: qty 3, 28d supply, fill #1

## 2022-05-21 ENCOUNTER — Other Ambulatory Visit: Payer: Self-pay

## 2022-05-22 DIAGNOSIS — H538 Other visual disturbances: Secondary | ICD-10-CM | POA: Diagnosis not present

## 2022-05-22 DIAGNOSIS — H524 Presbyopia: Secondary | ICD-10-CM | POA: Diagnosis not present

## 2022-05-22 DIAGNOSIS — H52223 Regular astigmatism, bilateral: Secondary | ICD-10-CM | POA: Diagnosis not present

## 2022-05-27 ENCOUNTER — Other Ambulatory Visit: Payer: Self-pay

## 2022-06-03 ENCOUNTER — Other Ambulatory Visit: Payer: Self-pay

## 2022-06-23 ENCOUNTER — Other Ambulatory Visit: Payer: Self-pay

## 2022-06-25 ENCOUNTER — Other Ambulatory Visit: Payer: Self-pay | Admitting: Family Medicine

## 2022-06-25 DIAGNOSIS — Z1231 Encounter for screening mammogram for malignant neoplasm of breast: Secondary | ICD-10-CM

## 2022-07-28 ENCOUNTER — Encounter: Payer: 59 | Admitting: Family Medicine

## 2022-07-31 ENCOUNTER — Ambulatory Visit
Admission: RE | Admit: 2022-07-31 | Discharge: 2022-07-31 | Disposition: A | Discharge status: Home / Self Care | Payer: 59 | Source: Ambulatory Visit | Attending: Family Medicine

## 2022-07-31 DIAGNOSIS — Z1231 Encounter for screening mammogram for malignant neoplasm of breast: Secondary | ICD-10-CM | POA: Diagnosis not present

## 2022-08-14 ENCOUNTER — Ambulatory Visit (INDEPENDENT_AMBULATORY_CARE_PROVIDER_SITE_OTHER): Payer: 59 | Admitting: Family Medicine

## 2022-08-14 ENCOUNTER — Encounter: Payer: Self-pay | Admitting: Family Medicine

## 2022-08-14 ENCOUNTER — Other Ambulatory Visit: Payer: Self-pay

## 2022-08-14 VITALS — BP 125/76 | HR 81 | Ht 61.0 in | Wt 206.0 lb

## 2022-08-14 DIAGNOSIS — Z114 Encounter for screening for human immunodeficiency virus [HIV]: Secondary | ICD-10-CM

## 2022-08-14 DIAGNOSIS — G43109 Migraine with aura, not intractable, without status migrainosus: Secondary | ICD-10-CM | POA: Diagnosis not present

## 2022-08-14 DIAGNOSIS — E559 Vitamin D deficiency, unspecified: Secondary | ICD-10-CM

## 2022-08-14 DIAGNOSIS — Z0000 Encounter for general adult medical examination without abnormal findings: Secondary | ICD-10-CM | POA: Insufficient documentation

## 2022-08-14 DIAGNOSIS — E538 Deficiency of other specified B group vitamins: Secondary | ICD-10-CM | POA: Diagnosis not present

## 2022-08-14 DIAGNOSIS — Z1159 Encounter for screening for other viral diseases: Secondary | ICD-10-CM

## 2022-08-14 DIAGNOSIS — E782 Mixed hyperlipidemia: Secondary | ICD-10-CM | POA: Diagnosis not present

## 2022-08-14 MED ORDER — CYANOCOBALAMIN 1000 MCG/ML IJ SOLN
1000.00 ug | INTRAMUSCULAR | 12 refills | Status: AC
Start: 2022-08-14 — End: ?
  Filled 2022-08-14: qty 6, 90d supply, fill #0
  Filled 2022-09-07: qty 1, 30d supply, fill #0

## 2022-08-14 NOTE — Assessment & Plan Note (Signed)
Chronic  Stable  Asymptomatic today  Given samples for 60mg  quilpta  Will follow up in 2 months

## 2022-08-14 NOTE — Assessment & Plan Note (Signed)
Chronic  BMI 38.92  She will continue wegovy 1mg  until she completes her available supply, insurance will no longer cover uptitrated doses and patient reports she can not afford out of pocket price  Advised patient to take 30-40 min walks 4-5 days per week to help with continued weight loss  She is down from 219lbs  to 206lbs Encouraged well balanced meals with vegetables and continuing wegovy 1mg  once weekly

## 2022-08-14 NOTE — Assessment & Plan Note (Signed)
Chronic conditions are stable  Patient was counseled on benefits of regular physical activity with goal of 150 minutes of moderate to vigurous intensity 4 days per week  Patient was counseled to consume well balanced diet of fruits, vegetables, limited saturated fats and limited sugary foods and beverages with emphasis on consuming 6-8 glasses of water daily  Screening recommended today: HIV and Hep C Vaccines recommended today: Shingrix and COVID vaccines   Patient is UTD on age appropriate screenings

## 2022-08-14 NOTE — Patient Instructions (Signed)

## 2022-08-14 NOTE — Progress Notes (Addendum)
I,[name],acting as a Neurosurgeon for [employer], MD.,have documented all relevant documentation on the behalf of Ronnald Ramp, MD,as directed by  Ronnald Ramp, MD while in the presence of Ronnald Ramp, MD.   Complete physical exam   Patient: Kaylee Jones   DOB: 21-Sep-1960   62 y.o. Female  MRN: [id number] Visit Date: 08/14/2022  Today's healthcare provider: Ronnald Ramp, MD   No chief complaint on file.  Subjective    Kaylee Jones is a 62 y.o. female who presents today for a complete physical exam.   She reports consuming a general diet. The patient does not participate in regular exercise at present. She generally feels well. She reports sleeping well. She does not have additional problems to discuss today.  HPI  -HIV Screening: -Shingles Vaccine:  Obesity  Kaylee Jones presents for weight management follow up for obesity Patient has been on semaglitude 1.7mg  and reports tolerating medication well  Starting weight 219lbs  Weight today is 206 lb  Goal weight is 147 lb   Migraine HA  -viox, excedrin toradol in the past  Would like to try quilpta Samples given  Would like to discuss more at follow up visit   Past Medical History:  Diagnosis Date   Anal fissure 08/2018   Anxiety    GERD (gastroesophageal reflux disease)    Past Surgical History:  Procedure Laterality Date   COLONOSCOPY WITH PROPOFOL N/A 02/09/2019   Procedure: COLONOSCOPY WITH PROPOFOL;  Surgeon: Toledo, Boykin Nearing, MD;  Location: [facility] ENDOSCOPY;  Service: Gastroenterology;  Laterality: N/A;   LAPAROSCOPIC CHOLECYSTECTOMY     TONSILLECTOMY     TUBAL LIGATION     Social History   Socioeconomic History   Marital status: Married    Spouse name: Not on file   Number of children: Not on file   Years of education: Not on file   Highest education level: Not on file  Occupational History   Occupation: asst director of pain management   Tobacco Use   Smoking status: Never   Smokeless tobacco: Never  Vaping Use   Vaping Use: Never used  Substance and Sexual Activity   Alcohol use: Yes    Alcohol/week: 0.0 standard drinks of alcohol    Comment: rare   Drug use: No   Sexual activity: Not on file  Other Topics Concern   Not on file  Social History Narrative   Not on file   Social Determinants of Health   Financial Resource Strain: Not on file  Food Insecurity: Not on file  Transportation Needs: Not on file  Physical Activity: Not on file  Stress: Not on file  Social Connections: Not on file  Intimate Partner Violence: Not on file   Family Status  Relation Name Status   Mother  Alive   Father  Deceased   Neg Hx  (Not Specified)   Family History  Problem Relation Age of Onset   Hypertension Mother    Esophageal varices Father    Breast cancer Neg Hx    Allergies  Allergen Reactions   Codeine     Patient reports she hallucinates     Patient Care Team: Simmons-Robinson, Tawanna Cooler, MD as PCP - General (Family Medicine)   Medications: Outpatient Medications Prior to Visit  Medication Sig   acetaminophen (TYLENOL) 325 MG tablet Take 650 mg by mouth every 4 (four) hours as needed.   ALPRAZolam (XANAX) 0.25 MG tablet Take 1 tablet (0.25 mg total) by  mouth as needed for anxiety.   hydrocortisone (ANUSOL-HC) 25 MG suppository UNWRAP AND INSERT 1 SUPPOSITORY RECTALLY TWO TIMES DAILY FOR 14 DAYS   ibuprofen (ADVIL) 800 MG tablet Take 800 mg by mouth every 6 (six) hours as needed.   [START ON 08/28/2022] Semaglutide-Weight Management 2.4 MG/0.75ML SOAJ Inject 2.4 mg into the skin once a week for 28 days. (Patient not taking: Reported on 08/14/2022)   [DISCONTINUED] cyanocobalamin (VITAMIN B12) 1000 MCG/ML injection INJECTION 1 ML INTO THE MUSCLE ONCE WEEKLY FOR 4 WEEKS. THEN ONCE MONTHLY.   Cholecalciferol (D-3-5) 125 MCG (5000 UT) capsule  (Patient not taking: Reported on 08/14/2022)   Semaglutide-Weight  Management 1 MG/0.5ML SOAJ Inject 1 mg into the skin once a week for 28 days.   Semaglutide-Weight Management 1.7 MG/0.75ML SOAJ Inject 1.7 mg into the skin once a week for 28 days.   No facility-administered medications prior to visit.    Review of Systems    Objective    BP 125/76 (BP Location: Left Arm, Patient Position: Sitting, Cuff Size: Large)   Pulse 81   Ht 5\' 1"  (1.549 m)   Wt 206 lb (93.4 kg)   LMP 04/06/2016   BMI 38.92 kg/m     Physical Exam Vitals reviewed.  Constitutional:      General: She is not in acute distress.    Appearance: Normal appearance. She is obese. She is not ill-appearing, toxic-appearing or diaphoretic.  HENT:     Head: Normocephalic and atraumatic.     Right Ear: Tympanic membrane and external ear normal. There is no impacted cerumen.     Left Ear: Tympanic membrane and external ear normal. There is no impacted cerumen.     Nose: Nose normal.     Mouth/Throat:     Pharynx: Oropharynx is clear.  Eyes:     General: No scleral icterus.    Extraocular Movements: Extraocular movements intact.     Conjunctiva/sclera: Conjunctivae normal.     Pupils: Pupils are equal, round, and reactive to light.  Cardiovascular:     Rate and Rhythm: Normal rate and regular rhythm.     Pulses: Normal pulses.     Heart sounds: Normal heart sounds. No murmur heard.    No friction rub. No gallop.  Pulmonary:     Effort: Pulmonary effort is normal. No respiratory distress.     Breath sounds: Normal breath sounds. No wheezing, rhonchi or rales.  Abdominal:     General: Bowel sounds are normal. There is no distension.     Palpations: Abdomen is soft. There is no mass.     Tenderness: There is no abdominal tenderness. There is no guarding.  Musculoskeletal:        General: No deformity.     Cervical back: Normal range of motion and neck supple. No rigidity.     Right lower leg: No edema.     Left lower leg: No edema.  Lymphadenopathy:     Cervical: No  cervical adenopathy.  Skin:    General: Skin is warm.     Capillary Refill: Capillary refill takes less than 2 seconds.     Findings: No erythema or rash.  Neurological:     General: No focal deficit present.     Mental Status: She is alert and oriented to person, place, and time.     Motor: No weakness.     Gait: Gait normal.  Psychiatric:        Mood and Affect: Mood normal.  Behavior: Behavior normal.       Last depression screening scores    08/14/2022    2:04 PM 02/19/2022    3:15 PM 11/24/2021    9:04 AM  PHQ 2/9 Scores  PHQ - 2 Score 0 1 0  PHQ- 9 Score 2 3 1    Last fall risk screening    08/14/2022    2:03 PM  Fall Risk   Falls in the past year? 0  Number falls in past yr: 0  Injury with Fall? 0  Risk for fall due to : No Fall Risks  Follow up Falls evaluation completed   Last Audit-C alcohol use screening    08/14/2022    2:03 PM  Alcohol Use Disorder Test (AUDIT)  1. How often do you have a drink containing alcohol? 1  2. How many drinks containing alcohol do you have on a typical day when you are drinking? 0  3. How often do you have six or more drinks on one occasion? 0  AUDIT-C Score 1   A score of 3 or more in women, and 4 or more in men indicates increased risk for alcohol abuse, EXCEPT if all of the points are from question 1   No results found for any visits on 08/14/22.  Assessment & Plan    Routine Health Maintenance and Physical Exam  Exercise Activities and Dietary recommendations  Goals      Weight (lb) < 200 lb (90.7 kg)     Patient would like to reach goal weight of 147 lbs         Immunization History  Administered Date(s) Administered   Influenza-Unspecified 12/07/2018   PFIZER(Purple Top)SARS-COV-2 Vaccination 02/27/2019, 03/20/2019   Tdap 07/18/2020    Health Maintenance  Topic Date Due   HIV Screening  Never done   Zoster Vaccines- Shingrix (1 of 2) Never done   COVID-19 Vaccine (3 - 2023-24 season) 10/31/2021    INFLUENZA VACCINE  10/01/2022   PAP SMEAR-Modifier  07/19/2023   MAMMOGRAM  07/30/2024   Colonoscopy  02/08/2029   DTaP/Tdap/Td (2 - Td or Tdap) 07/19/2030   Hepatitis C Screening  Completed   HPV VACCINES  Aged Out    Problem List Items Addressed This Visit       Cardiovascular and Mediastinum   Migraine with aura and without status migrainosus, not intractable    Chronic  Stable  Asymptomatic today  Given samples for 60mg  quilpta  Will follow up in 2 months         Other   Obesity, Class III, BMI 40-49.9 (morbid obesity) (HCC)    Chronic  BMI 38.92  She will continue wegovy 1mg  until she completes her available supply, insurance will no longer cover uptitrated doses and patient reports she can not afford out of pocket price  Advised patient to take 30-40 min walks 4-5 days per week to help with continued weight loss  She is down from 219lbs  to 206lbs Encouraged well balanced meals with vegetables and continuing wegovy 1mg  once weekly       Annual physical exam - Primary    Chronic conditions are stable  Patient was counseled on benefits of regular physical activity with goal of 150 minutes of moderate to vigurous intensity 4 days per week  Patient was counseled to consume well balanced diet of fruits, vegetables, limited saturated fats and limited sugary foods and beverages with emphasis on consuming 6-8 glasses of water daily  Screening recommended today:  HIV and Hep C Vaccines recommended today: Shingrix and COVID vaccines   Patient is UTD on age appropriate screenings       Other Visit Diagnoses     Screening for HIV (human immunodeficiency virus)       Relevant Orders   HIV antibody (with reflex)   Encounter for hepatitis C screening test for low risk patient       Relevant Orders   Hepatitis C Antibody   Mixed hyperlipidemia       Relevant Orders   Lipid Profile   Morbid obesity (HCC)       Relevant Orders   Comprehensive metabolic panel   CBC    Hemoglobin A1c   Vitamin D deficiency       Relevant Orders   Vitamin D (25 hydroxy)   B12 deficiency       Relevant Medications   cyanocobalamin (VITAMIN B12) 1000 MCG/ML injection   Other Relevant Orders   CBC   B12        Return in about 2 months (around 10/14/2022) for obesity,migraine.       The entirety of the information documented in the History of Present Illness, Review of Systems and Physical Exam were personally obtained by me. Portions of this information were initially documented by Acey Lav . I, Ronnald Ramp, MD have reviewed the documentation above for thoroughness and accuracy.      Ronnald Ramp, MD  [facility] [phone] (phone) [phone] (fax)  [facility]

## 2022-08-15 LAB — VITAMIN B12: Vitamin B-12: 338 pg/mL (ref 232–1245)

## 2022-08-15 LAB — LIPID PANEL
Chol/HDL Ratio: 4.9 ratio — ABNORMAL HIGH (ref 0.0–4.4)
Cholesterol, Total: 230 mg/dL — ABNORMAL HIGH (ref 100–199)
HDL: 47 mg/dL (ref >39)
LDL Chol Calc (NIH): 166 mg/dL — ABNORMAL HIGH (ref 0–99)
Triglycerides: 98 mg/dL (ref 0–149)
VLDL Cholesterol Cal: 17 mg/dL (ref 5–40)

## 2022-08-15 LAB — COMPREHENSIVE METABOLIC PANEL
ALT: 13 IU/L (ref 0–32)
AST: 18 IU/L (ref 0–40)
Albumin: 4.3 g/dL (ref 3.9–4.9)
Alkaline Phosphatase: 118 IU/L (ref 44–121)
BUN/Creatinine Ratio: 9 — ABNORMAL LOW (ref 12–28)
BUN: 9 mg/dL (ref 8–27)
Bilirubin Total: 0.5 mg/dL (ref 0.0–1.2)
CO2: 23 mmol/L (ref 20–29)
Calcium: 9.4 mg/dL (ref 8.7–10.3)
Chloride: 103 mmol/L (ref 96–106)
Creatinine, Ser: 1.02 mg/dL — ABNORMAL HIGH (ref 0.57–1.00)
Globulin, Total: 3 g/dL (ref 1.5–4.5)
Glucose: 89 mg/dL (ref 70–99)
Potassium: 4.5 mmol/L (ref 3.5–5.2)
Sodium: 138 mmol/L (ref 134–144)
Total Protein: 7.3 g/dL (ref 6.0–8.5)
eGFR: 63 mL/min/{1.73_m2} (ref >59)

## 2022-08-15 LAB — CBC
Hematocrit: 40.7 % (ref 34.0–46.6)
Hemoglobin: 13.9 g/dL (ref 11.1–15.9)
MCH: 28.6 pg (ref 26.6–33.0)
MCHC: 34.2 g/dL (ref 31.5–35.7)
MCV: 84 fL (ref 79–97)
Platelets: 377 10*3/uL (ref 150–450)
RBC: 4.86 x10E6/uL (ref 3.77–5.28)
RDW: 13.4 % (ref 11.7–15.4)
WBC: 10.5 10*3/uL (ref 3.4–10.8)

## 2022-08-15 LAB — HIV ANTIBODY (ROUTINE TESTING W REFLEX): HIV Screen 4th Generation wRfx: NONREACTIVE

## 2022-08-15 LAB — VITAMIN D 25 HYDROXY (VIT D DEFICIENCY, FRACTURES): Vit D, 25-Hydroxy: 32.4 ng/mL (ref 30.0–100.0)

## 2022-08-15 LAB — HEMOGLOBIN A1C
Est. average glucose Bld gHb Est-mCnc: 111 mg/dL
Hgb A1c MFr Bld: 5.5 % (ref 4.8–5.6)

## 2022-08-15 LAB — HEPATITIS C ANTIBODY: Hep C Virus Ab: NONREACTIVE

## 2022-08-26 IMAGING — MG MM DIGITAL SCREENING BILAT W/ TOMO AND CAD
8 series · 8 of 24 positions shown · non-contrast
Comparison: Previous exam(s).

CLINICAL DATA: Screening.

EXAM:
DIGITAL SCREENING BILATERAL MAMMOGRAM WITH TOMOSYNTHESIS AND CAD
TECHNIQUE: Bilateral screening digital craniocaudal and mediolateral oblique
mammograms were obtained. Bilateral screening digital breast
tomosynthesis was performed. The images were evaluated with
computer-aided detection.

[R CC synth-2D]
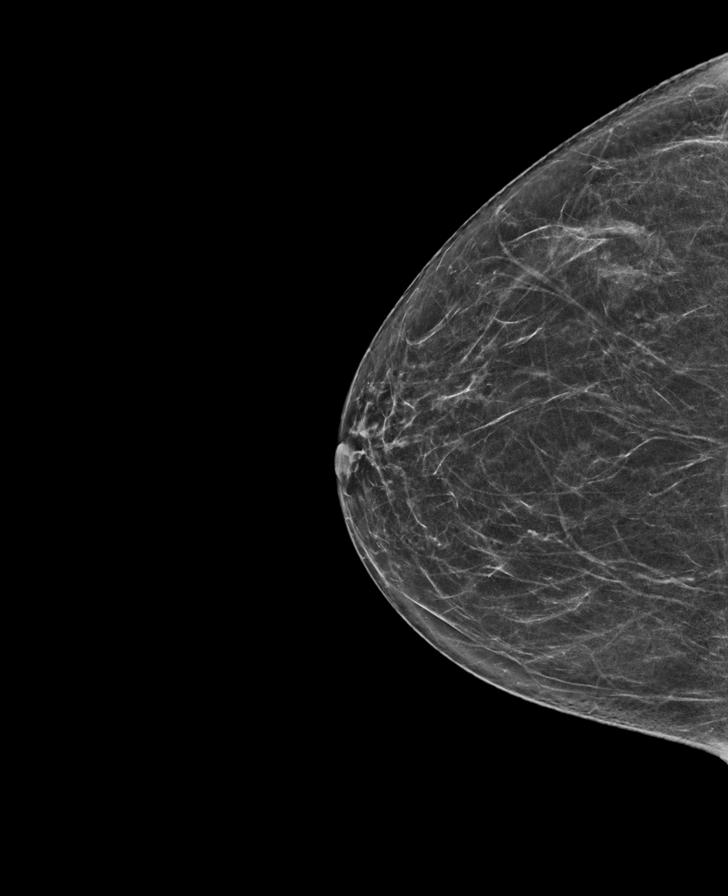

[L MLO synth-2D]
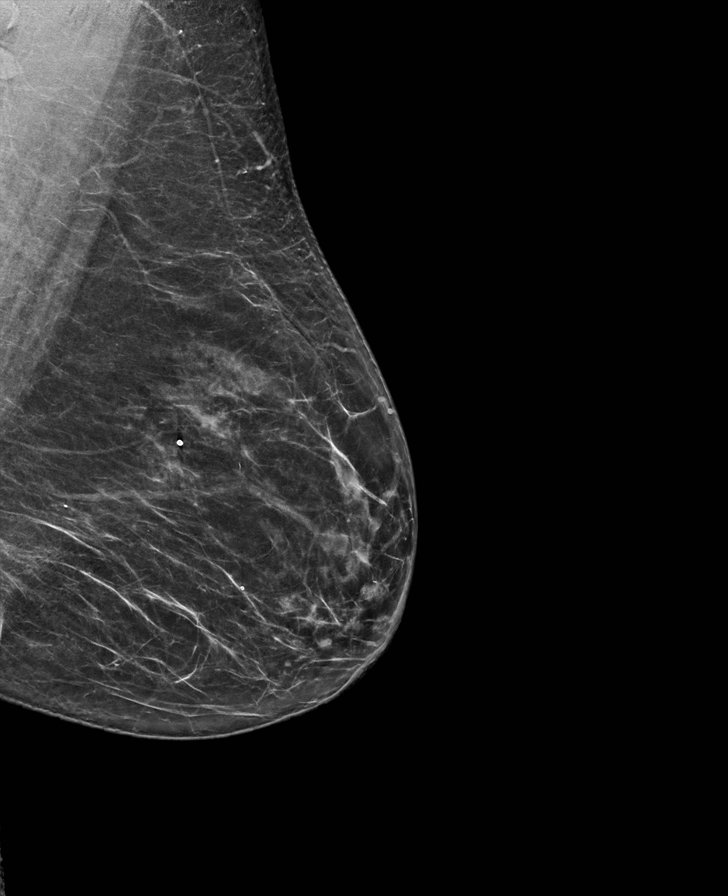

[L CC synth-2D]
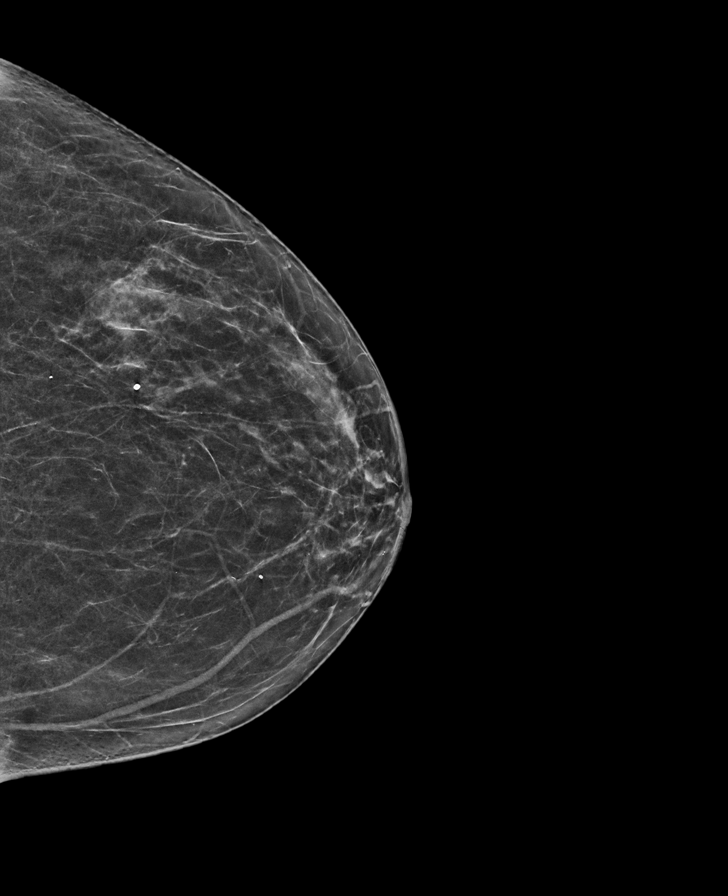

[R MLO synth-2D]
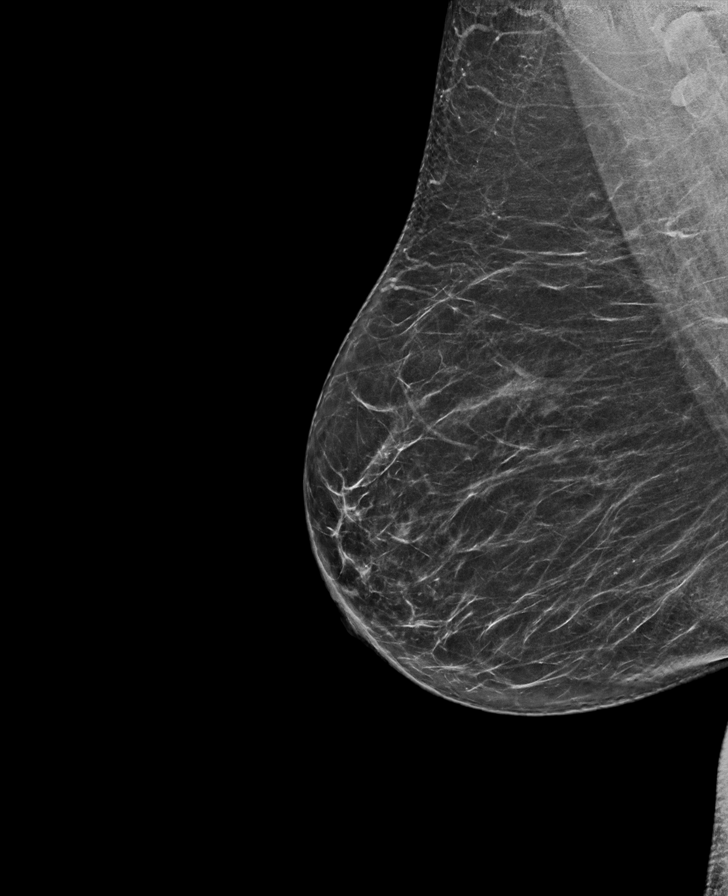

[R CC tomo · tomo slice 31/61.0]
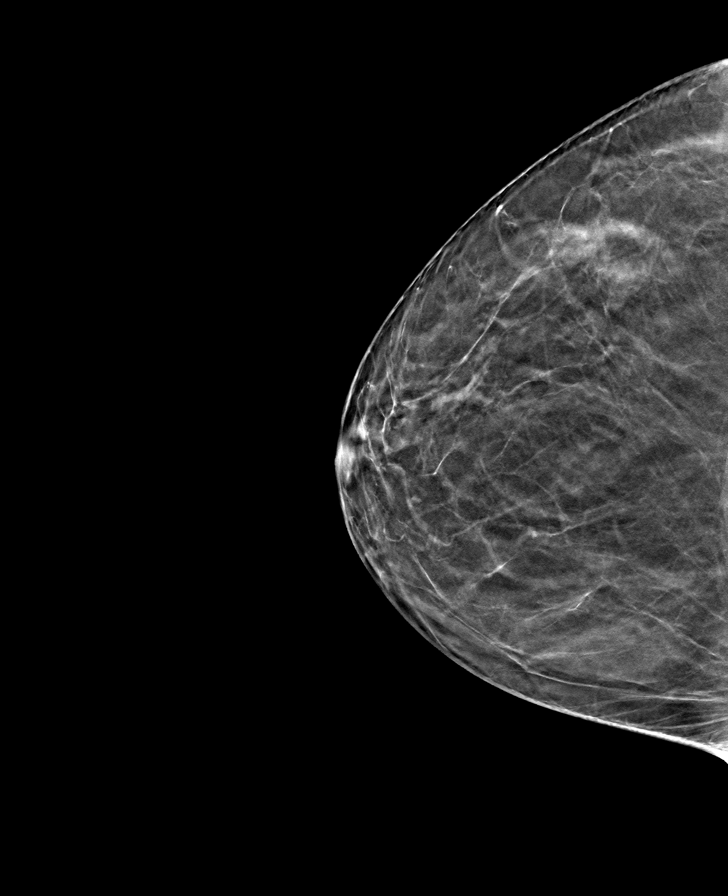

[R MLO tomo · tomo slice 35/70.0]
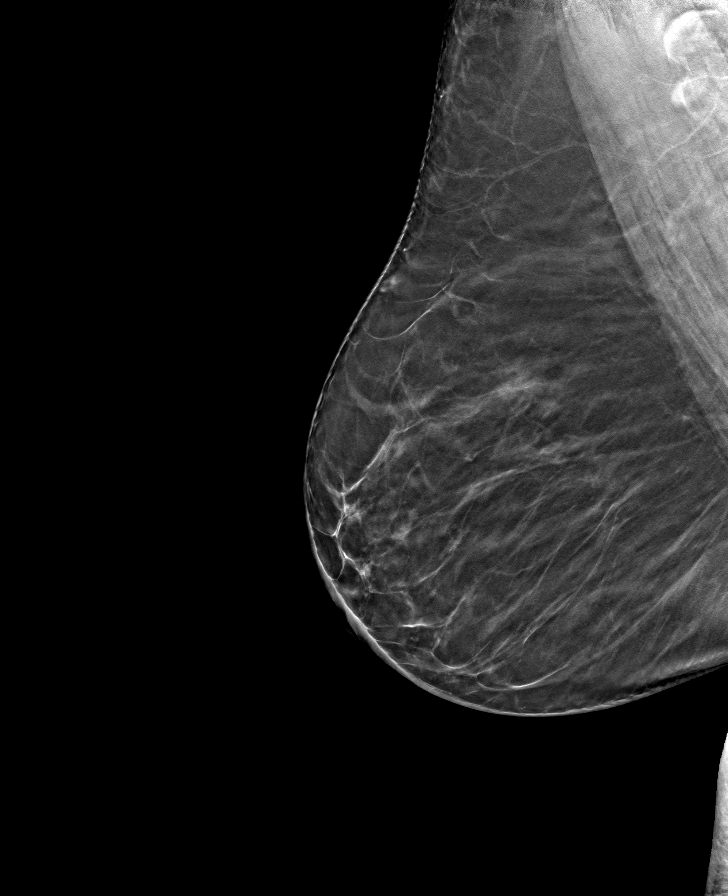

[L MLO tomo · tomo slice 37/72.0]
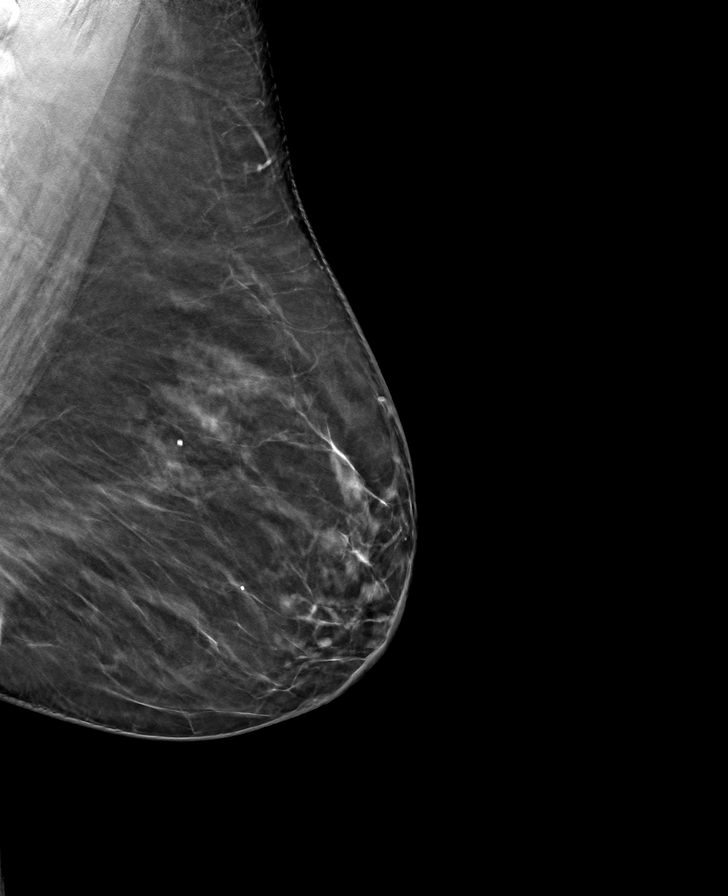

[L CC tomo · tomo slice 31/61.0]
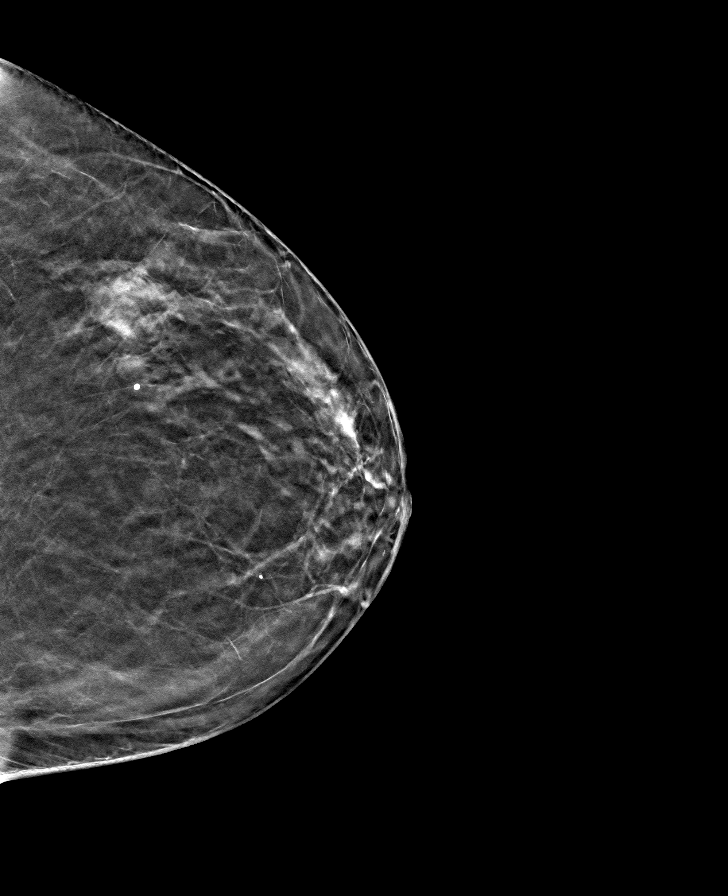

[8 of 24 positions shown; findings below may reference images not displayed]

ACR Breast Density Category b: There are scattered areas of
fibroglandular density.
FINDINGS: There are no findings suspicious for malignancy.
IMPRESSION: No mammographic evidence of malignancy. A result letter of this
screening mammogram will be mailed directly to the patient.

RECOMMENDATION:
Screening mammogram in one year. (Code:51-O-LD2)

BI-RADS CATEGORY  1: Negative.

## 2022-09-01 ENCOUNTER — Other Ambulatory Visit: Payer: Self-pay

## 2022-09-07 ENCOUNTER — Other Ambulatory Visit: Payer: Self-pay

## 2022-09-22 ENCOUNTER — Other Ambulatory Visit: Payer: Self-pay

## 2022-09-29 ENCOUNTER — Other Ambulatory Visit: Payer: Self-pay

## 2022-10-23 DIAGNOSIS — D225 Melanocytic nevi of trunk: Secondary | ICD-10-CM | POA: Diagnosis not present

## 2022-10-23 DIAGNOSIS — D2272 Melanocytic nevi of left lower limb, including hip: Secondary | ICD-10-CM | POA: Diagnosis not present

## 2022-10-23 DIAGNOSIS — D2261 Melanocytic nevi of right upper limb, including shoulder: Secondary | ICD-10-CM | POA: Diagnosis not present

## 2022-10-23 DIAGNOSIS — D2262 Melanocytic nevi of left upper limb, including shoulder: Secondary | ICD-10-CM | POA: Diagnosis not present

## 2022-10-23 DIAGNOSIS — L821 Other seborrheic keratosis: Secondary | ICD-10-CM | POA: Diagnosis not present

## 2022-10-23 DIAGNOSIS — D2271 Melanocytic nevi of right lower limb, including hip: Secondary | ICD-10-CM | POA: Diagnosis not present

## 2022-11-04 ENCOUNTER — Other Ambulatory Visit: Payer: Self-pay

## 2022-12-14 ENCOUNTER — Ambulatory Visit: Payer: 59 | Admitting: Family Medicine

## 2022-12-14 ENCOUNTER — Encounter: Payer: Self-pay | Admitting: Family Medicine

## 2022-12-14 ENCOUNTER — Other Ambulatory Visit: Payer: Self-pay

## 2022-12-14 VITALS — BP 124/78 | Ht 61.0 in | Wt 193.0 lb

## 2022-12-14 DIAGNOSIS — E538 Deficiency of other specified B group vitamins: Secondary | ICD-10-CM

## 2022-12-14 DIAGNOSIS — F419 Anxiety disorder, unspecified: Secondary | ICD-10-CM | POA: Diagnosis not present

## 2022-12-14 MED ORDER — SEMAGLUTIDE-WEIGHT MANAGEMENT 2.4 MG/0.75ML ~~LOC~~ SOAJ
2.4000 mg | SUBCUTANEOUS | 1 refills | Status: DC
Start: 2022-12-14 — End: 2023-10-22
  Filled 2022-12-14: qty 3, 28d supply, fill #0
  Filled 2022-12-14: qty 6, 56d supply, fill #0
  Filled 2023-03-01: qty 3, 28d supply, fill #1

## 2022-12-14 NOTE — Assessment & Plan Note (Signed)
Infrequent use of Xanax 0.25mg , less than once a month. No new concerns raised. Chronic Stable  -Continue Xanax 0.25mg  PRN for anxiety.

## 2022-12-14 NOTE — Progress Notes (Signed)
Established patient visit   Patient: Kaylee Jones   DOB: April 12, 1960   62 y.o. Female  MRN: [id number] Visit Date: 12/14/2022  Today's healthcare provider: Ronnald Ramp, MD   Chief Complaint  Patient presents with   Weight Check   Medication Refill   Subjective       Discussed the use of AI scribe software for clinical note transcription with the patient, who gave verbal consent to proceed.  History of Present Illness   The patient, with a history of weight management issues, presents with a significant weight loss of 21 pounds over the past eight months. She attributes this weight loss to dietary changes, increased physical activity, and the use of semaglutide 2.4mg , which she has been taking every other week due to cost concerns. She reports occasional constipation but denies any other gastrointestinal issues.  The patient also mentions a recent episode of diarrhea, which she believes was due to a stomach bug. Despite the cost and occasional constipation, she expresses satisfaction with the semaglutide, noting that she only uses it more frequently when she feels particularly hungry.  In addition to her weight management, the patient is also on a vitamin B12 supplement, which she only takes when feeling extremely tired, and Xanax 0.25mg  for anxiety, which she uses less than once a month. She expresses a desire to continue her weight loss journey, with a goal weight in the 140s.         Past Medical History:  Diagnosis Date   Anal fissure 08/2018   Anxiety    GERD (gastroesophageal reflux disease)     Medications: Outpatient Medications Prior to Visit  Medication Sig   acetaminophen (TYLENOL) 325 MG tablet Take 650 mg by mouth every 4 (four) hours as needed.   ALPRAZolam (XANAX) 0.25 MG tablet Take 1 tablet (0.25 mg total) by mouth as needed for anxiety.   Cholecalciferol (D-3-5) 125 MCG (5000 UT) capsule    cyanocobalamin (VITAMIN B12) 1000 MCG/ML  injection Inject 1 mL (1,000 mcg total) into the muscle once a week for 4 weeks, then once monthly.   ibuprofen (ADVIL) 800 MG tablet Take 800 mg by mouth every 6 (six) hours as needed.   [DISCONTINUED] Semaglutide-Weight Management 1 MG/0.5ML SOAJ Inject 1 mg into the skin once a week for 28 days.   [DISCONTINUED] Semaglutide-Weight Management 1.7 MG/0.75ML SOAJ Inject 1.7 mg into the skin once a week for 28 days.   [DISCONTINUED] Semaglutide-Weight Management 2.4 MG/0.75ML SOAJ Inject 2.4 mg into the skin once a week for 28 days. (Patient not taking: Reported on 08/14/2022)   No facility-administered medications prior to visit.    Review of Systems  Last metabolic panel Lab Results  Component Value Date   GLUCOSE 89 08/14/2022   NA 138 08/14/2022   K 4.5 08/14/2022   CL 103 08/14/2022   CO2 23 08/14/2022   BUN 9 08/14/2022   CREATININE 1.02 (H) 08/14/2022   EGFR 63 08/14/2022   CALCIUM 9.4 08/14/2022   PROT 7.3 08/14/2022   ALBUMIN 4.3 08/14/2022   LABGLOB 3.0 08/14/2022   AGRATIO 1.7 07/24/2021   BILITOT 0.5 08/14/2022   ALKPHOS 118 08/14/2022   AST 18 08/14/2022   ALT 13 08/14/2022   Last lipids Lab Results  Component Value Date   CHOL 230 (H) 08/14/2022   HDL 47 08/14/2022   LDLCALC 166 (H) 08/14/2022   TRIG 98 08/14/2022   CHOLHDL 4.9 (H) 08/14/2022   Last hemoglobin A1c  Lab Results  Component Value Date   HGBA1C 5.5 08/14/2022        Objective    BP 124/78 (BP Location: Left Arm, Patient Position: Sitting, Cuff Size: Large)   Ht 5\' 1"  (1.549 m)   Wt 193 lb (87.5 kg)   LMP 04/06/2016   BMI 36.47 kg/m  BP Readings from Last 3 Encounters:  12/14/22 124/78  08/14/22 125/76  04/24/22 119/81   Wt Readings from Last 3 Encounters:  12/14/22 193 lb (87.5 kg)  08/14/22 206 lb (93.4 kg)  04/24/22 214 lb 12.8 oz (97.4 kg)       Physical Exam Vitals reviewed.  Constitutional:      General: She is not in acute distress.    Appearance: Normal  appearance. She is not ill-appearing, toxic-appearing or diaphoretic.  Eyes:     Conjunctiva/sclera: Conjunctivae normal.  Cardiovascular:     Rate and Rhythm: Normal rate and regular rhythm.     Pulses: Normal pulses.     Heart sounds: Normal heart sounds. No murmur heard.    No friction rub. No gallop.  Pulmonary:     Effort: Pulmonary effort is normal. No respiratory distress.     Breath sounds: Normal breath sounds. No stridor. No wheezing, rhonchi or rales.  Abdominal:     General: Bowel sounds are normal. There is no distension.     Palpations: Abdomen is soft.     Tenderness: There is no abdominal tenderness.  Musculoskeletal:     Right lower leg: No edema.     Left lower leg: No edema.  Skin:    Findings: No erythema or rash.  Neurological:     Mental Status: She is alert and oriented to person, place, and time.       No results found for any visits on 12/14/22.  Assessment & Plan     Problem List Items Addressed This Visit     Anxiety    Infrequent use of Xanax 0.25mg , less than once a month. No new concerns raised. Chronic Stable  -Continue Xanax 0.25mg  PRN for anxiety.      Obesity, Class III, BMI 40-49.9 (morbid obesity) (HCC) - Primary    Significant weight loss over the past 8 months (from 214 to 193 lbs). Patient attributes weight loss to increased physical activity and dietary changes. Currently on semaglutide 2.4mg , but due to cost, patient is only taking it every other week. No significant side effects reported. Chronic, improving -Continue current regimen of semaglutide 2.4mg  every other week. -Encourage continued physical activity and healthy dietary habits. -Refill prescription for semaglutide 2.4mg .      Relevant Medications   Semaglutide-Weight Management 2.4 MG/0.75ML SOAJ   Vitamin B12 deficiency    Vitamin B12 Deficiency Patient has been prescribed a vitamin B12 supplement but reports not taking it regularly. No new symptoms  reported. Chronic -Encourage regular use of vitamin B12 supplement, especially when feeling tired.              Return in about 3 months (around 03/16/2023) for Weight MGMT.       Ronnald Ramp, MD  [facility] [phone] (phone) [phone] (fax)  [facility]

## 2022-12-14 NOTE — Assessment & Plan Note (Signed)
Significant weight loss over the past 8 months (from 214 to 193 lbs). Patient attributes weight loss to increased physical activity and dietary changes. Currently on semaglutide 2.4mg , but due to cost, patient is only taking it every other week. No significant side effects reported. Chronic, improving -Continue current regimen of semaglutide 2.4mg  every other week. -Encourage continued physical activity and healthy dietary habits. -Refill prescription for semaglutide 2.4mg .

## 2022-12-14 NOTE — Assessment & Plan Note (Signed)
Vitamin B12 Deficiency Patient has been prescribed a vitamin B12 supplement but reports not taking it regularly. No new symptoms reported. Chronic -Encourage regular use of vitamin B12 supplement, especially when feeling tired.

## 2023-03-01 ENCOUNTER — Other Ambulatory Visit: Payer: Self-pay

## 2023-03-17 ENCOUNTER — Other Ambulatory Visit: Payer: Self-pay

## 2023-03-17 ENCOUNTER — Encounter: Payer: Self-pay | Admitting: Family Medicine

## 2023-03-17 ENCOUNTER — Ambulatory Visit: Payer: 59 | Admitting: Family Medicine

## 2023-03-17 VITALS — BP 126/82 | HR 85 | Ht 61.0 in | Wt 190.1 lb

## 2023-03-17 MED ORDER — ETODOLAC 400 MG PO TABS
400.0000 mg | ORAL_TABLET | Freq: Three times a day (TID) | ORAL | 0 refills | Status: AC
  Filled 2023-03-17: qty 30, 10d supply, fill #0

## 2023-03-17 MED ORDER — AMOXICILLIN 500 MG PO CAPS
500.0000 mg | ORAL_CAPSULE | Freq: Four times a day (QID) | ORAL | 0 refills | Status: AC
  Filled 2023-03-17: qty 30, 8d supply, fill #0

## 2023-03-17 NOTE — Progress Notes (Signed)
 Established patient visit   Patient: Kaylee Jones   DOB: [birth year]   63 y.o. Female  MRN: [id number] Visit Date: 03/17/2023  Today's healthcare provider: Mimi Alt, MD   Chief Complaint  Patient presents with   Weight Management Screening    Last injection on Monday   Subjective     HPI     Weight Management Screening    Additional comments: Last injection on Monday      Last edited by Pasty Bongo, CMA on 03/17/2023  4:01 PM.       Discussed the use of AI scribe software for clinical note transcription with the patient, who gave verbal consent to proceed.  History of Present Illness   The patient is a 63 year old female who presents for obesity management. She has been on semaglutide  2.4 mg once weekly, but has had issues with refills for the past four weeks. Despite this, she has seen a significant weight loss, down from 206 pounds in June 2024 to 190 pounds currently. However, she reports a recent weight gain, up from 185 pounds before Christmas to 191 pounds currently.  The patient reports that the semaglutide  is effective, reducing her appetite significantly, but it also causes her to feel nauseated. Her diet consists of small meals, such as a small salad and a hot dog in a day. She admits to not exercising as much due to the cold weather, but expresses a desire to return to the gym and use the elliptical machine.  The patient also reports a recent increase in blood pressure, which she attributes to stress related to a family dispute over the sale of her mother's house. She denies any other new symptoms or changes in her health.         Past Medical History:  Diagnosis Date   Anal fissure 08/2018   Anxiety    GERD (gastroesophageal reflux disease)     Medications: Outpatient Medications Prior to Visit  Medication Sig   acetaminophen (TYLENOL) 325 MG tablet Take 650 mg by mouth every 4 (four) hours as needed.   ALPRAZolam  (XANAX ) 0.25  MG tablet Take 1 tablet (0.25 mg total) by mouth as needed for anxiety.   amoxicillin  (AMOXIL ) 500 MG capsule Take 1 capsule by mouth every 6 hours.   Cholecalciferol (D-3-5) 125 MCG (5000 UT) capsule    cyanocobalamin  (VITAMIN B12) 1000 MCG/ML injection Inject 1 mL (1,000 mcg total) into the muscle once a week for 4 weeks, then once monthly.   etodolac  (LODINE ) 400 MG tablet Take 1 tablet (400 mg total) by mouth every 8 (eight) hours for pain.   ibuprofen (ADVIL) 800 MG tablet Take 800 mg by mouth every 6 (six) hours as needed.   Semaglutide -Weight Management 2.4 MG/0.75ML SOAJ Inject 2.4 mg into the skin once a week for 28 days.   No facility-administered medications prior to visit.    Review of Systems  Last metabolic panel Lab Results  Component Value Date   GLUCOSE 89 08/14/2022   NA 138 08/14/2022   K 4.5 08/14/2022   CL 103 08/14/2022   CO2 23 08/14/2022   BUN 9 08/14/2022   CREATININE 1.02 (H) 08/14/2022   EGFR 63 08/14/2022   CALCIUM 9.4 08/14/2022   PROT 7.3 08/14/2022   ALBUMIN 4.3 08/14/2022   LABGLOB 3.0 08/14/2022   AGRATIO 1.7 07/24/2021   BILITOT 0.5 08/14/2022   ALKPHOS 118 08/14/2022   AST 18 08/14/2022   ALT 13 08/14/2022  Last hemoglobin A1c Lab Results  Component Value Date   HGBA1C 5.5 08/14/2022   Last thyroid functions Lab Results  Component Value Date   TSH 2.210 07/24/2021        Objective    BP 126/82 (BP Location: Left Arm, Cuff Size: Normal)   Pulse 85   Ht 5\' 1"  (1.549 m)   Wt 190 lb 1.6 oz (86.2 kg)   LMP 04/06/2016   SpO2 100%   BMI 35.92 kg/m   BP Readings from Last 3 Encounters:  03/17/23 126/82  12/14/22 124/78  08/14/22 125/76   Wt Readings from Last 3 Encounters:  03/17/23 190 lb 1.6 oz (86.2 kg)  12/14/22 193 lb (87.5 kg)  08/14/22 206 lb (93.4 kg)        Physical Exam Vitals reviewed.  Constitutional:      General: She is not in acute distress.    Appearance: Normal appearance. She is not ill-appearing,  toxic-appearing or diaphoretic.  Eyes:     Conjunctiva/sclera: Conjunctivae normal.  Cardiovascular:     Rate and Rhythm: Normal rate and regular rhythm.     Pulses: Normal pulses.     Heart sounds: Normal heart sounds. No murmur heard.    No friction rub. No gallop.  Pulmonary:     Effort: Pulmonary effort is normal. No respiratory distress.     Breath sounds: Normal breath sounds. No stridor. No wheezing, rhonchi or rales.  Abdominal:     General: Bowel sounds are normal. There is no distension.     Palpations: Abdomen is soft.     Tenderness: There is no abdominal tenderness.  Musculoskeletal:     Right lower leg: No edema.     Left lower leg: No edema.  Skin:    Findings: No erythema or rash.  Neurological:     Mental Status: She is alert and oriented to person, place, and time.       No results found for any visits on 03/17/23.  Assessment & Plan     Problem List Items Addressed This Visit       Other   Obesity, Class III, BMI 40-49.9 (morbid obesity) (HCC) - Primary   63 year old presenting for obesity management. Currently on semaglutide  2.4 mg once weekly. Weight decreased from 206 lbs in June 2024 to 190 lbs today, with a noted increase to 191 lbs around Christmas. BMI decreased from 38 to 35.92. Reports significant nausea with semaglutide , which effectively suppresses appetite. Discussed maintaining physical activity and dietary changes. Prefers using gym membership and elliptical machine for exercise. - Continue semaglutide  2.4 mg once weekly - Ensure prescription refills are available - Encourage increased physical activity, including using the gym membership and elliptical machine - Schedule follow-up in April 2025          Return in about 3 months (around 06/15/2023) for Weight MGMT.         Mimi Alt, MD  [facility] [phone] (phone) [phone] (fax)  [facility]

## 2023-03-17 NOTE — Assessment & Plan Note (Addendum)
 63 year old presenting for obesity management. Currently on semaglutide  2.4 mg once weekly. Weight decreased from 206 lbs in June 2024 to 190 lbs today, with a noted increase to 191 lbs around Christmas. BMI decreased from 38 to 35.92. Reports significant nausea with semaglutide , which effectively suppresses appetite. Discussed maintaining physical activity and dietary changes. Prefers using gym membership and elliptical machine for exercise. - Continue semaglutide  2.4 mg once weekly - Ensure prescription refills are available - Encourage increased physical activity, including using the gym membership and elliptical machine - Schedule follow-up in April 2025

## 2023-06-04 DIAGNOSIS — H52223 Regular astigmatism, bilateral: Secondary | ICD-10-CM | POA: Diagnosis not present

## 2023-06-04 DIAGNOSIS — H524 Presbyopia: Secondary | ICD-10-CM | POA: Diagnosis not present

## 2023-06-15 ENCOUNTER — Ambulatory Visit: Payer: Self-pay | Admitting: Family Medicine

## 2023-08-03 ENCOUNTER — Encounter: Payer: Self-pay | Admitting: Family Medicine

## 2023-08-03 ENCOUNTER — Other Ambulatory Visit: Payer: Self-pay

## 2023-08-03 MED ORDER — HYDROCORTISONE ACETATE 25 MG RE SUPP
25.0000 mg | Freq: Two times a day (BID) | RECTAL | 0 refills | Status: AC
  Filled 2023-08-03: qty 12, 6d supply, fill #0

## 2023-08-16 ENCOUNTER — Encounter: Payer: Self-pay | Admitting: Family Medicine

## 2023-08-16 ENCOUNTER — Ambulatory Visit: Payer: Self-pay

## 2023-08-16 ENCOUNTER — Other Ambulatory Visit (INDEPENDENT_AMBULATORY_CARE_PROVIDER_SITE_OTHER): Payer: Self-pay | Admitting: Radiology

## 2023-08-16 ENCOUNTER — Ambulatory Visit: Admitting: Family Medicine

## 2023-08-16 VITALS — BP 140/82 | HR 88 | Ht 61.0 in | Wt 204.0 lb

## 2023-08-16 DIAGNOSIS — M25561 Pain in right knee: Secondary | ICD-10-CM | POA: Insufficient documentation

## 2023-08-16 MED ORDER — TRIAMCINOLONE ACETONIDE 40 MG/ML IJ SUSP: 40.0000 mg | Freq: Once | INTRAMUSCULAR | Status: AC

## 2023-08-16 NOTE — Telephone Encounter (Signed)
 FYI Only or Action Required?: FYI only for provider  Patient was last seen in primary care on 03/17/2023 by Kaylee Alt, MD. Called Nurse Triage reporting Knee Injury. Symptoms began several weeks ago. Interventions attempted: OTC medications: Ibuprofen and Other: crutches when walking. Symptoms are: unchanged.  Triage Disposition: See PCP When Office is Open (Within 3 Days)  Patient/caregiver understands and will follow disposition?: Yes  No appts with PCP, scheduled Ortho appt with Dr. Augustus Jones today at 240  Copied from CRM [id number]. Topic: Clinical - Red Word Triage >> Aug 16, 2023 10:42 AM Kaylee Jones wrote: Red Word that prompted transfer to Nurse Triage: Patient is calling to report that she had an injury to her right knee. Having right knee. Unable to walk with out using crutch. Reason for Disposition  Knee giving way (or buckling) when walking  Answer Assessment - Initial Assessment Questions 1. MECHANISM: How did the injury happen? (e.g., twisting injury, direct blow)      Unsure how injury occurred but went to [place] this past week and felt a pulling sensation.  2. ONSET: When did the injury happen? (Minutes or hours ago)      1 month  3. LOCATION: Where is the injury located?      R knee  5. SEVERITY: Can you put weight on that leg? Can you walk?      Yes just hurts when walking  6. SIZE: For cuts, bruises, or swelling, ask: How large is it? (e.g., inches or centimeters;  entire joint)      No swelling  7. PAIN: Is there pain? If Yes, ask: How bad is the pain?  What does it keep you from doing? (e.g., Scale 1-10; or mild, moderate, severe)   -  NONE: (0): no pain   -  MILD (1-3): doesn't interfere with normal activities    -  MODERATE (4-7): interferes with normal activities (e.g., work or school) or awakens from sleep, limping    -  SEVERE (8-10): excruciating pain, unable to do any normal activities, unable to walk      9. OTHER SYMPTOMS: Do you  have any other symptoms?  (e.g., pop when knee injured, swelling, locking, buckling)      Having to use crutches  Protocols used: Knee Injury-A-AH

## 2023-08-16 NOTE — Progress Notes (Signed)
 Primary Care / Sports Medicine Office Visit  Patient Information:  Patient ID: Kaylee Jones, female DOB: 1960/10/16 Age: 63 y.o. MRN: [id number]   Kaylee Jones is a pleasant 63 y.o. female presenting with the following:  Chief Complaint  Patient presents with   Knee Pain    Right knee pain since 08/11/23. Patent had to get crutches to walk. Could not bear weight, having difficulty going from a sit to stand position. Patient is walking with one crutch today. Having pain with extension. Patient has been taking Etodilac for pain. No xray's.     Vitals:   08/16/23 1446  BP: (!) 140/82  Pulse: 88  SpO2: 96%   Vitals:   08/16/23 1446  Weight: 204 lb (92.5 kg)  Height: 5' 1 (1.549 m)   Body mass index is 38.55 kg/m.  No results found.   Independent interpretation of notes and tests performed by another provider:   None  Procedures performed:   Procedure:  Injection of right knee under ultrasound guidance. Ultrasound guidance utilized for anteromedial approach, joint space visualized Samsung HS60 device utilized with permanent recording / reporting. Verbal informed consent obtained and verified. Skin prepped in a sterile fashion. Ethyl chloride for topical local analgesia.  Completed without difficulty and tolerated well. Medication: triamcinolone acetonide 40 mg/mL suspension for injection 1 mL total and 2 mL lidocaine  1% without epinephrine utilized for needle placement anesthetic Advised to contact for fevers/chills, erythema, induration, drainage, or persistent bleeding.   Pertinent History, Exam, Impression, and Recommendations:   Problem List Items Addressed This Visit     Patellofemoral arthralgia of right knee - Primary   History of Present Illness Kaylee Jones is a 63 year old female who presents with right knee pain and difficulty walking.  She has been experiencing right knee pain for approximately one month. Initially, the pain was mild and occurred  primarily when standing up after sitting for extended periods, particularly at her desk. The pain was localized to the outer part of the right knee.  The knee pain significantly worsened during a vacation in [place] , where she engaged in extensive walking, including up and down mountains. On June 11th, while stepping out of a 'birdcage' at [employer], she felt a 'catch' in her knee, describing it as if her kneecap was going to pop out. Following this incident, she was unable to walk without assistance and began using crutches. Since the incident, she has been using crutches, initially requiring two and now managing with one. No swelling or bruising of the knee. No buckling episodes, but she feels the knee could give out, which is why she continues to use a crutch.  For pain management, she took etodolac  at a dose of 400 mg, and a Tylenol 1000 mg, on the first two days following the incident. She has since stopped taking it as she transitioned to using one crutch.  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  Physical Exam INSPECTION: No abnormalities or ecchymosis on inspection of the right knee. PALPATION: Trace to 1+ effusion, warmth, and nontender lateral and medial patellar facets, quadriceps tendon, patellar tendon, medial and lateral joint lines on palpation of the right knee. RANGE OF MOTION: Range of motion from 0 to 115-120 degrees, limited by significant pain and terminal flexion in the right knee. SPECIAL TESTS: Negative anterior and posterior drawer tests, McMurray test negative with discomfort anterolaterally around the patella, and negative for laxity  with varus and valgus stress on the right knee.  Physical Exam: Inspection reveals no abnormalities. No ecchymosis. Questionable trace to one plus effusion. Nontender lateral and medial patellar facets. Range of motion from zero to 115-120 degrees, limited by significant pain and terminal flexion.  Nontender quadriceps tendon, patellar tendon, medial joint line, and lateral joint line. Negative anterior and posterior drawer. McMurray negative, however, does localize discomfort anterolaterally around the patella. Negative for laxity with varus and valgus stress. (08/16/2023)  Assessment and Plan Patellofemoral arthralgia -right Acute right knee patellofemoral pain syndrome likely due to cartilage inflammation.  Cannot exclude osteoarthritis however given the lack of stated chronicity, can represent discrete chondral irritation from relative increase in activity.  Discussed NSAIDs and cortisone injection. She opted for cortisone for quicker relief. Explained cortisone's role in reducing inflammation and pain, potential side effects, and importance of rest and strengthening exercises. X-rays deferred unless symptoms persist beyond two weeks. - Administer cortisone injection with triamcinolone (Kenalog 40 mg) mixed with lidocaine  and mepivacaine. - Advise Etodolac  three times daily with food until cortisone takes effect. - Recommend ice application to the knee. - Provide exercises through MyChart to start post-cortisone effect. - Advise relative rest, avoiding heavy lifting or strenuous activity for two days. - Instruct to avoid knee brace unless necessary, and to contact if symptoms persist or worsen.      Relevant Orders   US  LIMITED JOINT SPACE STRUCTURES LOW RIGHT     Orders & Medications Medications:  Meds ordered this encounter  Medications   triamcinolone acetonide (KENALOG-40) injection 40 mg   Orders Placed This Encounter  Procedures   US  LIMITED JOINT SPACE STRUCTURES LOW RIGHT     No follow-ups on file.     Ma Saupe, MD, [facility]   Primary Care Sports Medicine Primary Care and Sports Medicine at [facility]

## 2023-08-16 NOTE — Telephone Encounter (Signed)
 Reviewed  Agree with scheduled appt for eval

## 2023-08-16 NOTE — Patient Instructions (Signed)
 You have just been given a cortisone injection to reduce pain and inflammation. After the injection you may notice immediate relief of pain as a result of the Lidocaine . It is important to rest the area of the injection for 24 to 48 hours after the injection. There is a possibility of some temporary increased discomfort and swelling for up to 72 hours until the cortisone begins to work. If you do have pain, simply rest the joint and use ice. If you can tolerate over the counter medications, you can try Tylenol, Aleve, or Advil for added relief per package instructions.  Patient Action Plan  1. Pain Relief:    - You received a cortisone injection to help reduce knee pain and inflammation.    - Take Etodolac  three times daily with food until the cortisone takes effect.  2. Self-Care:    - Apply ice to your knee to help manage pain.    - Rest your knee by avoiding heavy lifting or strenuous activities for two days.    - Avoid using a knee brace unless absolutely necessary.  3. Exercise:    - Start the exercises provided through MyChart once the cortisone begins to work.  Red Flags: - If your symptoms persist or worsen, please contact your healthcare provider.

## 2023-08-16 NOTE — Assessment & Plan Note (Addendum)
 History of Present Illness Kaylee Jones is a 63 year old female who presents with right knee pain and difficulty walking.  She has been experiencing right knee pain for approximately one month. Initially, the pain was mild and occurred primarily when standing up after sitting for extended periods, particularly at her desk. The pain was localized to the outer part of the right knee.  The knee pain significantly worsened during a vacation in [place] , where she engaged in extensive walking, including up and down mountains. On June 11th, while stepping out of a 'birdcage' at [place], she felt a 'catch' in her knee, describing it as if her kneecap was going to pop out. Following this incident, she was unable to walk without assistance and began using crutches. Since the incident, she has been using crutches, initially requiring two and now managing with one. No swelling or bruising of the knee. No buckling episodes, but she feels the knee could give out, which is why she continues to use a crutch.  For pain management, she took etodolac  at a dose of 400 mg, and a Tylenol 1000 mg, on the first two days following the incident. She has since stopped taking it as she transitioned to using one crutch.  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  Physical Exam INSPECTION: No abnormalities or ecchymosis on inspection of the right knee. PALPATION: Trace to 1+ effusion, warmth, and nontender lateral and medial patellar facets, quadriceps tendon, patellar tendon, medial and lateral joint lines on palpation of the right knee. RANGE OF MOTION: Range of motion from 0 to 115-120 degrees, limited by significant pain and terminal flexion in the right knee. SPECIAL TESTS: Negative anterior and posterior drawer tests, McMurray test negative with discomfort anterolaterally around the patella, and negative for laxity with varus and valgus stress on the right knee.  Physical Exam:  Inspection reveals no abnormalities. No ecchymosis. Questionable trace to one plus effusion. Nontender lateral and medial patellar facets. Range of motion from zero to 115-120 degrees, limited by significant pain and terminal flexion. Nontender quadriceps tendon, patellar tendon, medial joint line, and lateral joint line. Negative anterior and posterior drawer. McMurray negative, however, does localize discomfort anterolaterally around the patella. Negative for laxity with varus and valgus stress. (08/16/2023)  Assessment and Plan Patellofemoral arthralgia -right Acute right knee patellofemoral pain syndrome likely due to cartilage inflammation.  Cannot exclude osteoarthritis however given the lack of stated chronicity, can represent discrete chondral irritation from relative increase in activity.  Discussed NSAIDs and cortisone injection. She opted for cortisone for quicker relief. Explained cortisone's role in reducing inflammation and pain, potential side effects, and importance of rest and strengthening exercises. X-rays deferred unless symptoms persist beyond two weeks. - Administer cortisone injection with triamcinolone (Kenalog 40 mg) mixed with lidocaine  and mepivacaine. - Advise Etodolac  three times daily with food until cortisone takes effect. - Recommend ice application to the knee. - Provide exercises through MyChart to start post-cortisone effect. - Advise relative rest, avoiding heavy lifting or strenuous activity for two days. - Instruct to avoid knee brace unless necessary, and to contact if symptoms persist or worsen.

## 2023-08-31 ENCOUNTER — Other Ambulatory Visit: Payer: Self-pay | Admitting: Family Medicine

## 2023-08-31 DIAGNOSIS — Z1231 Encounter for screening mammogram for malignant neoplasm of breast: Secondary | ICD-10-CM

## 2023-09-13 ENCOUNTER — Ambulatory Visit
Admission: RE | Admit: 2023-09-13 | Discharge: 2023-09-13 | Disposition: A | Discharge status: Home / Self Care | Source: Ambulatory Visit | Attending: Family Medicine | Admitting: Family Medicine

## 2023-09-13 DIAGNOSIS — Z1231 Encounter for screening mammogram for malignant neoplasm of breast: Secondary | ICD-10-CM | POA: Diagnosis not present

## 2023-09-17 ENCOUNTER — Ambulatory Visit: Payer: Self-pay | Admitting: Family Medicine

## 2023-10-06 ENCOUNTER — Ambulatory Visit: Admitting: Physician Assistant

## 2023-10-06 ENCOUNTER — Encounter: Payer: Self-pay | Admitting: Physician Assistant

## 2023-10-06 VITALS — BP 128/81

## 2023-10-06 DIAGNOSIS — Z1283 Encounter for screening for malignant neoplasm of skin: Secondary | ICD-10-CM

## 2023-10-06 DIAGNOSIS — L821 Other seborrheic keratosis: Secondary | ICD-10-CM

## 2023-10-06 DIAGNOSIS — L814 Other melanin hyperpigmentation: Secondary | ICD-10-CM | POA: Diagnosis not present

## 2023-10-06 DIAGNOSIS — D229 Melanocytic nevi, unspecified: Secondary | ICD-10-CM

## 2023-10-06 DIAGNOSIS — D1801 Hemangioma of skin and subcutaneous tissue: Secondary | ICD-10-CM | POA: Diagnosis not present

## 2023-10-06 DIAGNOSIS — L578 Other skin changes due to chronic exposure to nonionizing radiation: Secondary | ICD-10-CM | POA: Diagnosis not present

## 2023-10-06 DIAGNOSIS — W908XXA Exposure to other nonionizing radiation, initial encounter: Secondary | ICD-10-CM | POA: Diagnosis not present

## 2023-10-06 NOTE — Progress Notes (Signed)
   New Patient Visit   Subjective  Kaylee Jones is a 63 y.o. female who presents for the following: Skin Cancer Screening and Full Body Skin Exam  Prior patient at [facility] in [place], [place]. No history of skin cancer.   The patient presents for Total-Body Skin Exam (TBSE) for skin cancer screening and mole check. The patient has spots, moles and lesions to be evaluated, some may be new or changing and the patient may have concern these could be cancer.    The following portions of the chart were reviewed this encounter and updated as appropriate: medications, allergies, medical history  Review of Systems:  No other skin or systemic complaints except as noted in HPI or Assessment and Plan.  Objective  Well appearing patient in no apparent distress; mood and affect are within normal limits.  A full examination was performed including scalp, head, eyes, ears, nose, lips, neck, chest, axillae, abdomen, back, buttocks, bilateral upper extremities, bilateral lower extremities, hands, feet, fingers, toes, fingernails, and toenails. All findings within normal limits unless otherwise noted below.   Relevant physical exam findings are noted in the Assessment and Plan.    Assessment & Plan   SKIN CANCER SCREENING PERFORMED TODAY.  ACTINIC DAMAGE - Chronic condition, secondary to cumulative UV/sun exposure - diffuse scaly erythematous macules with underlying dyspigmentation - Recommend daily broad spectrum sunscreen SPF 30+ to sun-exposed areas, reapply every 2 hours as needed.  - Staying in the shade or wearing long sleeves, sun glasses (UVA+UVB protection) and wide brim hats (4-inch brim around the entire circumference of the hat) are also recommended for sun protection.  - Call for new or changing lesions.  LENTIGINES, SEBORRHEIC KERATOSES, HEMANGIOMAS - Benign normal skin lesions - Benign-appearing - Call for any changes  MELANOCYTIC NEVI - Tan-brown and/or  pink-flesh-colored symmetric macules and papules - Benign appearing on exam today - Observation - Call clinic for new or changing moles - Recommend daily use of broad spectrum spf 30+ sunscreen to sun-exposed areas.       SCREENING EXAM FOR SKIN CANCER   LENTIGINES   SEBORRHEIC KERATOSIS   CHERRY ANGIOMA   ACTINIC SKIN DAMAGE   MULTIPLE BENIGN NEVI    Return in about 1 year (around 10/05/2024) for tbse.  I, Gordan Beams, CMA, am acting as scribe for Devory Mckinzie K, PA-C.   Documentation: I have reviewed the above documentation for accuracy and completeness, and I agree with the above.  Karigan Cloninger K, PA-C

## 2023-10-08 ENCOUNTER — Encounter: Admitting: Family Medicine

## 2023-10-22 ENCOUNTER — Encounter: Payer: Self-pay | Admitting: Family Medicine

## 2023-10-22 ENCOUNTER — Ambulatory Visit (INDEPENDENT_AMBULATORY_CARE_PROVIDER_SITE_OTHER): Admitting: Family Medicine

## 2023-10-22 VITALS — BP 133/86 | Resp 16 | Ht 61.0 in | Wt 209.0 lb

## 2023-10-22 DIAGNOSIS — Z0000 Encounter for general adult medical examination without abnormal findings: Secondary | ICD-10-CM

## 2023-10-22 DIAGNOSIS — G43109 Migraine with aura, not intractable, without status migrainosus: Secondary | ICD-10-CM | POA: Diagnosis not present

## 2023-10-22 DIAGNOSIS — Z13220 Encounter for screening for lipoid disorders: Secondary | ICD-10-CM | POA: Diagnosis not present

## 2023-10-22 DIAGNOSIS — Z131 Encounter for screening for diabetes mellitus: Secondary | ICD-10-CM

## 2023-10-22 DIAGNOSIS — E66813 Obesity, class 3: Secondary | ICD-10-CM

## 2023-10-22 DIAGNOSIS — E538 Deficiency of other specified B group vitamins: Secondary | ICD-10-CM

## 2023-10-22 DIAGNOSIS — Z130 Encounter for screening for diseases of the blood and blood-forming organs and certain disorders involving the immune mechanism: Secondary | ICD-10-CM

## 2023-10-22 MED ORDER — TIRZEPATIDE 2.5 MG/0.5ML ~~LOC~~ SOAJ: 2.5000 mg | SUBCUTANEOUS | Status: AC

## 2023-10-22 NOTE — Patient Instructions (Signed)
 It was a pleasure to see you today!  Thank you for choosing [facility] for your primary care.   Today you were seen for your annual physical  Please review the attached information regarding helpful preventive health topics.   To keep you healthy, please keep in mind the following health maintenance items that you are due for:   Health Maintenance Due  Topic Date Due   Zoster Vaccines- Shingrix (1 of 2) Never done   Pneumococcal Vaccine: 50+ Years (1 of 1 - PCV) Never done   COVID-19 Vaccine (3 - Pfizer risk series) 04/17/2019   INFLUENZA VACCINE  10/01/2023     Best Wishes,   Dr. Lang

## 2023-10-22 NOTE — Progress Notes (Signed)
 Complete physical exam   Patient: Kaylee Jones   DOB: 02/10/1961   63 y.o. Female  MRN: [id number] Visit Date: 10/22/2023  Today's healthcare provider: Rockie Agent, MD   Chief Complaint  Patient presents with   Annual Exam    CPE.Kaylee Jones No other corncens   Subjective    Kaylee Jones is a 63 y.o. female who presents today for a complete physical exam.    She does not have additional problems to discuss today.   Discussed the use of AI scribe software for clinical note transcription with the patient, who gave verbal consent to proceed.  History of Present Illness Kaylee Jones is a 63 year old female who presents for an annual physical exam.  She has a history of B12 deficiency, migraine with aura, patellofemoral arthralgia of the right knee, and class three obesity with a BMI of 39.49.  Regarding her obesity, she previously tried Wegovy  at a 2.4 mg dose but discontinued it due to severe nausea that left her bedridden for three days. While on Wegovy , her cravings for sweets were significantly reduced. She is considering trying another medication, Zepatide, but is concerned about potential nausea.  In terms of diet and exercise, she describes herself as 'fat' and admits to eating foods she is not supposed to, such as cake, ice cream, and pizza. She is involved in childcare, 'running after children', and tries to walk every day, although recent rain has limited her outdoor activities. She drinks mostly water and avoids sugary drinks as her children tend to consume them before she can.  She usually receives her flu vaccine at the hospital, but declines the COVID and pneumococcal vaccines. She is interested in the shingles vaccine.     Past Medical History:  Diagnosis Date   Anal fissure 08/2018   Anxiety    GERD (gastroesophageal reflux disease)    Past Surgical History:  Procedure Laterality Date   COLONOSCOPY WITH PROPOFOL  N/A 02/09/2019   Procedure:  COLONOSCOPY WITH PROPOFOL ;  Surgeon: Toledo, Ladell POUR, MD;  Location: [facility] END[facility]COPY;  Service: Gastroenterology;  Laterality: N/A;   LAPAROSCOPIC CHOLECYSTECTOMY     TONSILLECTOMY     TUBAL LIGATION     Social History   Socioeconomic History   Marital status: Married    Spouse name: Not on file   Number of children: Not on file   Years of education: Not on file   Highest education level: Bachelor's degree (e.g., BA, AB, BS)  Occupational History   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  Tobacco Use   Smoking status: Never   Smokeless tobacco: Never  Vaping Use   Vaping status: Never Used  Substance and Sexual Activity   Alcohol use: Yes    Alcohol/week: 0.0 standard drinks of alcohol    Comment: rare   Drug use: No   Sexual activity: Not on file  Other Topics Concern   Not on file  Social History Narrative   Not on file   Social Drivers of Health   Financial Resource Strain: Low Risk  (03/15/2023)   Overall Financial Resource Strain (CARDIA)    Difficulty of Paying Living Expenses: Not hard at all  Food Insecurity: No Food Insecurity (03/15/2023)   Hunger Vital Sign    Worried About Running Out of Food in the Last Year: Never true    Ran Out of Food in the Last Year: Never true  Transportation Needs: No Transportation Needs (03/15/2023)   PRAPARE -  Administrator, Civil Service (Medical): No    Lack of Transportation (Non-Medical): No  Physical Activity: Insufficiently Active (03/15/2023)   Exercise Vital Sign    Days of Exercise per Week: 3 days    Minutes of Exercise per Session: 20 min  Stress: No Stress Concern Present (03/15/2023)   [employer] of Occupational Health - Occupational Stress Questionnaire    Feeling of Stress : Only a little  Social Connections: Socially Integrated (03/15/2023)   Social Connection and Isolation Panel    Frequency of Communication with Friends and Family: Once a week    Frequency of Social Gatherings with  Friends and Family: Three times a week    Attends Religious Services: 1 to 4 times per year    Active Member of Clubs or Organizations: Yes    Attends Banker Meetings: 1 to 4 times per year    Marital Status: Married  Catering manager Violence: Not on file   Family Status  Relation Name Status   Mother  Alive   Father  Deceased   Neg Hx  (Not Specified)  No partnership data on file   Family History  Problem Relation Age of Onset   Hypertension Mother    Esophageal varices Father    Breast cancer Neg Hx    Allergies  Allergen Reactions   Codeine     Patient reports she hallucinates      Medications: Outpatient Medications Prior to Visit  Medication Sig Note   acetaminophen (TYLENOL) 325 MG tablet Take 650 mg by mouth every 4 (four) hours as needed.    ALPRAZolam  (XANAX ) 0.25 MG tablet Take 1 tablet (0.25 mg total) by mouth as needed for anxiety.    amoxicillin  (AMOXIL ) 500 MG capsule Take 1 capsule by mouth every 6 hours.    etodolac  (LODINE ) 400 MG tablet Take 1 tablet (400 mg total) by mouth every 8 (eight) hours for pain.    ibuprofen (ADVIL) 800 MG tablet Take 800 mg by mouth every 6 (six) hours as needed.    Cholecalciferol (D-3-5) 125 MCG (5000 UT) capsule  (Patient not taking: Reported on 10/22/2023)    cyanocobalamin  (VITAMIN B12) 1000 MCG/ML injection Inject 1 mL (1,000 mcg total) into the muscle once a week for 4 weeks, then once monthly. (Patient not taking: Reported on 10/22/2023)    hydrocortisone  (ANUSOL -HC) 25 MG suppository Unwrap and Place 1 suppository (25 mg total) rectally 2 (two) times daily. (Patient not taking: Reported on 10/22/2023)    [DISCONTINUED] Semaglutide -Weight Management 2.4 MG/0.75ML SOAJ Inject 2.4 mg into the skin once a week for 28 days. (Patient not taking: Reported on 10/22/2023) 10/22/2023: nausea x3 days for each visit   No facility-administered medications prior to visit.    Review of Systems  Last CBC Lab Results   Component Value Date   WBC 10.5 08/14/2022   HGB 13.9 08/14/2022   HCT 40.7 08/14/2022   MCV 84 08/14/2022   MCH 28.6 08/14/2022   RDW 13.4 08/14/2022   PLT 377 08/14/2022   Last metabolic panel Lab Results  Component Value Date   GLUCOSE 89 08/14/2022   NA 138 08/14/2022   K 4.5 08/14/2022   CL 103 08/14/2022   CO2 23 08/14/2022   BUN 9 08/14/2022   CREATININE 1.02 (H) 08/14/2022   EGFR 63 08/14/2022   CALCIUM 9.4 08/14/2022   PROT 7.3 08/14/2022   ALBUMIN 4.3 08/14/2022   LABGLOB 3.0 08/14/2022   AGRATIO 1.7 07/24/2021  BILITOT 0.5 08/14/2022   ALKPHOS 118 08/14/2022   AST 18 08/14/2022   ALT 13 08/14/2022   Last lipids Lab Results  Component Value Date   CHOL 230 (H) 08/14/2022   HDL 47 08/14/2022   LDLCALC 166 (H) 08/14/2022   TRIG 98 08/14/2022   CHOLHDL 4.9 (H) 08/14/2022   The 10-year ASCVD risk score (Arnett DK, et al., 2019) is: 5.2%  Last hemoglobin A1c Lab Results  Component Value Date   HGBA1C 5.5 08/14/2022   Last thyroid functions Lab Results  Component Value Date   TSH 2.210 07/24/2021   Last vitamin D  Lab Results  Component Value Date   VD25OH 32.4 08/14/2022   Last vitamin B12 and Folate Lab Results  Component Value Date   VITAMINB12 338 08/14/2022       Objective    BP 133/86 (BP Location: Right Arm, Patient Position: Sitting, Cuff Size: Normal)   Resp 16   Ht 5' 1 (1.549 m)   Wt 209 lb (94.8 kg)   LMP 04/06/2016   SpO2 98%   BMI 39.49 kg/m   BP Readings from Last 3 Encounters:  10/22/23 133/86  10/06/23 128/81  08/16/23 (!) 140/82   Wt Readings from Last 3 Encounters:  10/22/23 209 lb (94.8 kg)  08/16/23 204 lb (92.5 kg)  03/17/23 190 lb 1.6 oz (86.2 kg)        Physical Exam Vitals reviewed.  Constitutional:      General: She is not in acute distress.    Appearance: Normal appearance. She is not ill-appearing, toxic-appearing or diaphoretic.  HENT:     Head: Normocephalic and atraumatic.     Right  Ear: Tympanic membrane and external ear normal. There is no impacted cerumen.     Left Ear: Tympanic membrane and external ear normal. There is no impacted cerumen.     Nose: Nose normal.     Mouth/Throat:     Pharynx: Oropharynx is clear.  Eyes:     General: No scleral icterus.    Extraocular Movements: Extraocular movements intact.     Conjunctiva/sclera: Conjunctivae normal.     Pupils: Pupils are equal, round, and reactive to light.  Cardiovascular:     Rate and Rhythm: Normal rate and regular rhythm.     Pulses: Normal pulses.     Heart sounds: Normal heart sounds. No murmur heard.    No friction rub. No gallop.  Pulmonary:     Effort: Pulmonary effort is normal. No respiratory distress.     Breath sounds: Normal breath sounds. No wheezing, rhonchi or rales.  Abdominal:     General: Bowel sounds are normal. There is no distension.     Palpations: Abdomen is soft. There is no mass.     Tenderness: There is no abdominal tenderness. There is no guarding.  Musculoskeletal:        General: No deformity.     Cervical back: Normal range of motion and neck supple.     Right lower leg: No edema.     Left lower leg: No edema.  Lymphadenopathy:     Cervical: No cervical adenopathy.  Skin:    General: Skin is warm.     Capillary Refill: Capillary refill takes less than 2 seconds.     Findings: No erythema or rash.  Neurological:     General: No focal deficit present.     Mental Status: She is alert and oriented to person, place, and time.  Cranial Nerves: Cranial nerves 2-12 are intact. No cranial nerve deficit or facial asymmetry.     Motor: Motor function is intact. No weakness.     Gait: Gait normal.  Psychiatric:        Mood and Affect: Mood normal.        Behavior: Behavior normal.       Last depression screening scores    08/16/2023    2:52 PM 08/14/2022    2:04 PM 02/19/2022    3:15 PM  PHQ 2/9 Scores  PHQ - 2 Score 0 0 1  PHQ- 9 Score 0 2 3    Last fall risk  screening    08/16/2023    2:52 PM  Fall Risk   Falls in the past year? 0  Number falls in past yr: 0  Risk for fall due to : No Fall Risks  Follow up Falls evaluation completed    Last Audit-C alcohol use screening    03/15/2023    2:29 PM  Alcohol Use Disorder Test (AUDIT)  1. How often do you have a drink containing alcohol? 1  2. How many drinks containing alcohol do you have on a typical day when you are drinking? 0  3. How often do you have six or more drinks on one occasion? 0  AUDIT-C Score 1      Patient-reported   A score of 3 or more in women, and 4 or more in men indicates increased risk for alcohol abuse, EXCEPT if all of the points are from question 1   No results found for any visits on 10/22/23.  Assessment & Plan    Routine Health Maintenance and Physical Exam  Immunization History  Administered Date(s) Administered   Influenza-Unspecified 12/07/2018   PFIZER(Purple Top)SARS-COV-2 Vaccination 02/27/2019, 03/20/2019   Tdap 07/18/2020    Health Maintenance  Topic Date Due   Zoster Vaccines- Shingrix (1 of 2) Never done   Pneumococcal Vaccine: 50+ Years (1 of 1 - PCV) Never done   COVID-19 Vaccine (3 - Pfizer risk series) 04/17/2019   INFLUENZA VACCINE  10/01/2023   Cervical Cancer Screening (HPV/Pap Cotest)  07/18/2025   MAMMOGRAM  09/12/2025   Colonoscopy  02/08/2029   DTaP/Tdap/Td (2 - Td or Tdap) 07/19/2030   Hepatitis C Screening  Completed   HIV Screening  Completed   Hepatitis B Vaccines 19-59 Average Risk  Aged Out   HPV VACCINES  Aged Out   Meningococcal B Vaccine  Aged Out    Problem List Items Addressed This Visit       Cardiovascular and Mediastinum   Migraine with aura and without status migrainosus, not intractable     Other   Vitamin B12 deficiency   Relevant Orders   Vitamin B12   Obesity, Class III, BMI 40-49.9 (morbid obesity)   Relevant Medications   tirzepatide  (MOUNJARO ) 2.5 MG/0.5ML Pen   Other Relevant Orders    CMP14+EGFR   Annual physical exam - Primary   Other Visit Diagnoses       Screening for deficiency anemia       Relevant Orders   CBC     Screening for diabetes mellitus       Relevant Orders   Hemoglobin A1c     Screening for lipid disorders       Relevant Orders   Lipid panel       Assessment and Plan Assessment & Plan Adult Wellness Visit Annual physical examination conducted. Blood tests including A1c,  CBC, CMP, B12, and cholesterol ordered. Discussion on diet and exercise habits. Encouraged to increase protein intake and maintain hydration. Recommended 200-240 minutes of moderate intensity exercise per week. - Order A1c, CBC, CMP, B12, and cholesterol tests - Encourage 200-240 minutes of moderate intensity exercise per week - Advise at least 25 grams of protein per meal - Encourage hydration  Obesity, class 3 (BMI 39) Class 3 obesity with BMI of 39.49. Previous use of Wegovy  was discontinued due to severe side effects. Discussed potential use of tirzepatide  with awareness of possible nausea. Shared decision to try Zepbound  with sample doses before proceeding with prior authorization for full prescription if tolerated. - Provide sample doses of Zepbound  - Schedule follow-up appointment for weight management if Zepbound  is tolerated - Submit prior authorization for if tolerated  Vitamin B12 deficiency Vitamin B12 deficiency. Blood test for B12 levels ordered as part of annual physical. - Order B12 level test  Anxiety disorder No specific discussion or changes in management for anxiety disorder during this visit.  General Health Maintenance Discussion on vaccinations including flu, COVID, shingles, and pneumococcal. Agreed to receive flu vaccine. Declined COVID and pneumococcal vaccines. Discussed benefits and side effects of shingles vaccine, agreed to receive it. Shingles vaccine is 90% effective in preventing shingles, with most experiencing temporary flu-like symptoms  post-vaccination. - recommend  flu vaccine - recommend shingles vaccine - Decline COVID vaccine - Decline pneumococcal vaccine       No follow-ups on file.       Rockie Agent, MD  [facility] [phone] (phone) [phone] (fax)  [facility]

## 2023-10-23 LAB — VITAMIN B12: Vitamin B-12: 192 pg/mL — ABNORMAL LOW (ref 232–1245)

## 2023-10-23 LAB — CMP14+EGFR
ALT: 12 IU/L (ref 0–32)
AST: 21 IU/L (ref 0–40)
Albumin: 4.2 g/dL (ref 3.9–4.9)
Alkaline Phosphatase: 127 IU/L — ABNORMAL HIGH (ref 44–121)
BUN/Creatinine Ratio: 11 — ABNORMAL LOW (ref 12–28)
BUN: 8 mg/dL (ref 8–27)
Bilirubin Total: 0.5 mg/dL (ref 0.0–1.2)
CO2: 22 mmol/L (ref 20–29)
Calcium: 9.1 mg/dL (ref 8.7–10.3)
Chloride: 103 mmol/L (ref 96–106)
Creatinine, Ser: 0.71 mg/dL (ref 0.57–1.00)
Globulin, Total: 2.9 g/dL (ref 1.5–4.5)
Glucose: 81 mg/dL (ref 70–99)
Potassium: 4 mmol/L (ref 3.5–5.2)
Sodium: 139 mmol/L (ref 134–144)
Total Protein: 7.1 g/dL (ref 6.0–8.5)
eGFR: 96 mL/min/1.73 (ref >59)

## 2023-10-23 LAB — LIPID PANEL
Chol/HDL Ratio: 3.5 ratio (ref 0.0–4.4)
Cholesterol, Total: 259 mg/dL — ABNORMAL HIGH (ref 100–199)
HDL: 73 mg/dL (ref >39)
LDL Chol Calc (NIH): 169 mg/dL — ABNORMAL HIGH (ref 0–99)
Triglycerides: 97 mg/dL (ref 0–149)
VLDL Cholesterol Cal: 17 mg/dL (ref 5–40)

## 2023-10-23 LAB — CBC
Hematocrit: 40.8 % (ref 34.0–46.6)
Hemoglobin: 13.4 g/dL (ref 11.1–15.9)
MCH: 28.9 pg (ref 26.6–33.0)
MCHC: 32.8 g/dL (ref 31.5–35.7)
MCV: 88 fL (ref 79–97)
Platelets: 341 x10E3/uL (ref 150–450)
RBC: 4.64 x10E6/uL (ref 3.77–5.28)
RDW: 14 % (ref 11.7–15.4)
WBC: 9.6 x10E3/uL (ref 3.4–10.8)

## 2023-10-23 LAB — HEMOGLOBIN A1C
Est. average glucose Bld gHb Est-mCnc: 105 mg/dL
Hgb A1c MFr Bld: 5.3 % (ref 4.8–5.6)

## 2023-10-25 ENCOUNTER — Ambulatory Visit: Payer: Self-pay | Admitting: Family Medicine

## 2024-10-09 ENCOUNTER — Ambulatory Visit: Admitting: Physician Assistant
